# Patient Record
Sex: Female | Born: 1980 | Race: Black or African American | Hispanic: No | Marital: Single | Smoking: Current every day smoker
Health system: Southern US, Community
[De-identification: ages and names within clinical notes are randomized; demographics above are authoritative.]

## PROBLEM LIST (undated history)

## (undated) DIAGNOSIS — D649 Anemia, unspecified: Secondary | ICD-10-CM

## (undated) DIAGNOSIS — I10 Essential (primary) hypertension: Secondary | ICD-10-CM

## (undated) DIAGNOSIS — B9681 Helicobacter pylori [H. pylori] as the cause of diseases classified elsewhere: Secondary | ICD-10-CM

## (undated) DIAGNOSIS — K219 Gastro-esophageal reflux disease without esophagitis: Secondary | ICD-10-CM

## (undated) DIAGNOSIS — K2970 Gastritis, unspecified, without bleeding: Secondary | ICD-10-CM

## (undated) DIAGNOSIS — K279 Peptic ulcer, site unspecified, unspecified as acute or chronic, without hemorrhage or perforation: Secondary | ICD-10-CM

## (undated) HISTORY — DX: Anemia, unspecified: D64.9

## (undated) HISTORY — PX: SHOULDER SURGERY: SHX246

## (undated) HISTORY — DX: Gastritis, unspecified, without bleeding: K29.70

## (undated) HISTORY — DX: Helicobacter pylori (H. pylori) as the cause of diseases classified elsewhere: B96.81

## (undated) HISTORY — DX: Gastro-esophageal reflux disease without esophagitis: K21.9

## (undated) HISTORY — DX: Peptic ulcer, site unspecified, unspecified as acute or chronic, without hemorrhage or perforation: K27.9

---

## 2013-12-29 ENCOUNTER — Encounter (HOSPITAL_BASED_OUTPATIENT_CLINIC_OR_DEPARTMENT_OTHER): Payer: Self-pay | Admitting: Emergency Medicine

## 2013-12-29 ENCOUNTER — Emergency Department (HOSPITAL_BASED_OUTPATIENT_CLINIC_OR_DEPARTMENT_OTHER)
Admission: EM | Admit: 2013-12-29 | Discharge: 2013-12-30 | Disposition: A | Discharge status: Home / Self Care | Payer: Medicaid Other | Attending: Emergency Medicine | Admitting: Emergency Medicine

## 2013-12-29 DIAGNOSIS — I10 Essential (primary) hypertension: Secondary | ICD-10-CM | POA: Insufficient documentation

## 2013-12-29 DIAGNOSIS — F17200 Nicotine dependence, unspecified, uncomplicated: Secondary | ICD-10-CM | POA: Diagnosis not present

## 2013-12-29 DIAGNOSIS — R059 Cough, unspecified: Secondary | ICD-10-CM

## 2013-12-29 DIAGNOSIS — R05 Cough: Secondary | ICD-10-CM | POA: Diagnosis present

## 2013-12-29 DIAGNOSIS — J069 Acute upper respiratory infection, unspecified: Secondary | ICD-10-CM | POA: Diagnosis not present

## 2013-12-29 HISTORY — DX: Essential (primary) hypertension: I10

## 2013-12-29 NOTE — ED Notes (Signed)
PT presents with chest congestion and sinus pain that started yesterday.

## 2013-12-30 ENCOUNTER — Emergency Department (HOSPITAL_BASED_OUTPATIENT_CLINIC_OR_DEPARTMENT_OTHER): Payer: Medicaid Other

## 2013-12-30 MED ORDER — HYDROCODONE-ACETAMINOPHEN 5-325 MG PO TABS: 2.0000 | ORAL_TABLET | ORAL | Status: DC | PRN
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
## 2013-12-30 NOTE — ED Provider Notes (Signed)
Medical screening examination/treatment/procedure(s) were performed by non-physician practitioner and as supervising physician I was immediately available for consultation/collaboration.    Dione Booze, MD 12/30/13 [phone]

## 2013-12-30 NOTE — Discharge Instructions (Signed)
Cough, Adult   A cough is a reflex that helps clear your throat and airways. It can help heal the body or may be a reaction to an irritated airway. A cough may only last 2 or 3 weeks (acute) or may last more than 8 weeks (chronic).   CAUSES  Acute cough:   Viral or bacterial infections.  Chronic cough:   Infections.   Allergies.   Asthma.   Post-nasal drip.   Smoking.   Heartburn or acid reflux.   Some medicines.   Chronic lung problems (COPD).   Cancer.  SYMPTOMS    Cough.   Fever.   Chest pain.   Increased breathing rate.   High-pitched whistling sound when breathing (wheezing).   Colored mucus that you cough up (sputum).  TREATMENT    A bacterial cough may be treated with antibiotic medicine.   A viral cough must run its course and will not respond to antibiotics.   Your caregiver may recommend other treatments if you have a chronic cough.  HOME CARE INSTRUCTIONS    Only take over-the-counter or prescription medicines for pain, discomfort, or fever as directed by your caregiver. Use cough suppressants only as directed by your caregiver.   Use a cold steam vaporizer or humidifier in your bedroom or home to help loosen secretions.   Sleep in a semi-upright position if your cough is worse at night.   Rest as needed.   Stop smoking if you smoke.  SEEK IMMEDIATE MEDICAL CARE IF:    You have pus in your sputum.   Your cough starts to worsen.   You cannot control your cough with suppressants and are losing sleep.   You begin coughing up blood.   You have difficulty breathing.   You develop pain which is getting worse or is uncontrolled with medicine.   You have a fever.  MAKE SURE YOU:    Understand these instructions.   Will watch your condition.   Will get help right away if you are not doing well or get worse.  Document Released: 10/07/2010 Document Revised: 07/03/2011 Document Reviewed: 10/07/2010  Exit[employer] Patient Information 2015 [employer]. This information is not intended  to replace advice given to you by your health care provider. Make sure you discuss any questions you have with your health care provider.    Chest Wall Pain  Chest wall pain is pain in or around the bones and muscles of your chest. It may take up to 6 weeks to get better. It may take longer if you must stay physically active in your work and activities.   CAUSES   Chest wall pain may happen on its own. However, it may be caused by:   A viral illness like the flu.   Injury.   Coughing.   Exercise.   Arthritis.   Fibromyalgia.   Shingles.  HOME CARE INSTRUCTIONS    Avoid overtiring physical activity. Try not to strain or perform activities that cause pain. This includes any activities using your chest or your abdominal and side muscles, especially if heavy weights are used.   Put ice on the sore area.   Put ice in a plastic bag.   Place a towel between your skin and the bag.   Leave the ice on for 15-20 minutes per hour while awake for the first 2 days.   Only take over-the-counter or prescription medicines for pain, discomfort, or fever as directed by your caregiver.  SEEK IMMEDIATE MEDICAL CARE   Will get help right away if you are not doing well or get worse. Document Released: 04/10/2005 Document Revised: 07/03/2011 Document Reviewed: 12/05/2010 [facility] Patient Information 2015 [place], [place]. This information is not intended to replace advice given to you by your health care provider. Make sure you discuss any questions you have with your health care provider.

## 2013-12-30 NOTE — ED Notes (Signed)
C/o chest congestion and cough onset yesterday  Not getting any better

## 2014-09-25 ENCOUNTER — Encounter (HOSPITAL_COMMUNITY): Payer: Self-pay | Admitting: Emergency Medicine

## 2014-09-25 ENCOUNTER — Emergency Department (HOSPITAL_COMMUNITY)
Admission: EM | Admit: 2014-09-25 | Discharge: 2014-09-26 | Disposition: A | Discharge status: Home / Self Care | Payer: Medicaid Other | Attending: Emergency Medicine | Admitting: Emergency Medicine

## 2014-09-25 DIAGNOSIS — Z3202 Encounter for pregnancy test, result negative: Secondary | ICD-10-CM | POA: Diagnosis not present

## 2014-09-25 DIAGNOSIS — I10 Essential (primary) hypertension: Secondary | ICD-10-CM | POA: Insufficient documentation

## 2014-09-25 DIAGNOSIS — M7912 Myalgia of auxiliary muscles, head and neck: Secondary | ICD-10-CM

## 2014-09-25 DIAGNOSIS — R51 Headache: Secondary | ICD-10-CM | POA: Insufficient documentation

## 2014-09-25 DIAGNOSIS — M62838 Other muscle spasm: Secondary | ICD-10-CM | POA: Diagnosis not present

## 2014-09-25 DIAGNOSIS — Z720 Tobacco use: Secondary | ICD-10-CM | POA: Insufficient documentation

## 2014-09-25 DIAGNOSIS — M542 Cervicalgia: Secondary | ICD-10-CM | POA: Diagnosis not present

## 2014-09-25 DIAGNOSIS — R519 Headache, unspecified: Secondary | ICD-10-CM

## 2014-09-25 LAB — CBC WITH DIFFERENTIAL/PLATELET
Basophils Absolute: 0 10*3/uL (ref 0.0–0.1)
Basophils Relative: 0 % (ref 0–1)
Eosinophils Absolute: 0.2 10*3/uL (ref 0.0–0.7)
Eosinophils Relative: 2 % (ref 0–5)
HCT: 33.3 % — ABNORMAL LOW (ref 36.0–46.0)
Hemoglobin: 10.6 g/dL — ABNORMAL LOW (ref 12.0–15.0)
LYMPHS PCT: 32 % (ref 12–46)
Lymphs Abs: 3.1 10*3/uL (ref 0.7–4.0)
MCH: 26 pg (ref 26.0–34.0)
MCHC: 31.8 g/dL (ref 30.0–36.0)
MCV: 81.8 fL (ref 78.0–100.0)
MONO ABS: 0.8 10*3/uL (ref 0.1–1.0)
Monocytes Relative: 9 % (ref 3–12)
NEUTROS ABS: 5.5 10*3/uL (ref 1.7–7.7)
NEUTROS PCT: 57 % (ref 43–77)
PLATELETS: 309 10*3/uL (ref 150–400)
RBC: 4.07 MIL/uL (ref 3.87–5.11)
RDW: 16.8 % — AB (ref 11.5–15.5)
WBC: 9.7 10*3/uL (ref 4.0–10.5)

## 2014-09-25 LAB — URINE MICROSCOPIC-ADD ON

## 2014-09-25 LAB — URINALYSIS, ROUTINE W REFLEX MICROSCOPIC
Bilirubin Urine: NEGATIVE
GLUCOSE, UA: NEGATIVE mg/dL
HGB URINE DIPSTICK: NEGATIVE
Ketones, ur: 15 mg/dL — AB
NITRITE: NEGATIVE
Protein, ur: 30 mg/dL — AB
SPECIFIC GRAVITY, URINE: 1.025 (ref 1.005–1.030)
UROBILINOGEN UA: 1 mg/dL (ref 0.0–1.0)
pH: 5 (ref 5.0–8.0)

## 2014-09-25 LAB — POC URINE PREG, ED: PREG TEST UR: NEGATIVE

## 2014-09-25 LAB — I-STAT CHEM 8, ED
BUN: 10 mg/dL (ref 6–20)
CHLORIDE: 105 mmol/L (ref 101–111)
CREATININE: 1.2 mg/dL — AB (ref 0.44–1.00)
Calcium, Ion: 1.17 mmol/L (ref 1.12–1.23)
Glucose, Bld: 100 mg/dL — ABNORMAL HIGH (ref 65–99)
HCT: 36 % (ref 36.0–46.0)
HEMOGLOBIN: 12.2 g/dL (ref 12.0–15.0)
Potassium: 3.3 mmol/L — ABNORMAL LOW (ref 3.5–5.1)
Sodium: 141 mmol/L (ref 135–145)
TCO2: 22 mmol/L (ref 0–100)

## 2014-09-25 MED ORDER — SODIUM CHLORIDE 0.9 % IV BOLUS (SEPSIS)
1000.0000 mL | Freq: Once | INTRAVENOUS | Status: AC
  Administered 2014-09-26: 1000 mL via INTRAVENOUS

## 2014-09-25 MED ORDER — DIAZEPAM 5 MG/ML IJ SOLN
5.0000 mg | Freq: Once | INTRAMUSCULAR | Status: AC
  Administered 2014-09-26: 5 mg via INTRAVENOUS
  Filled 2014-09-25: qty 2

## 2014-09-25 MED ORDER — DIPHENHYDRAMINE HCL 50 MG/ML IJ SOLN
12.5000 mg | Freq: Once | INTRAMUSCULAR | Status: AC
  Administered 2014-09-26: 12.5 mg via INTRAVENOUS
  Filled 2014-09-25: qty 1

## 2014-09-25 MED ORDER — KETOROLAC TROMETHAMINE 30 MG/ML IJ SOLN
30.0000 mg | Freq: Once | INTRAMUSCULAR | Status: AC
  Administered 2014-09-26: 30 mg via INTRAVENOUS
  Filled 2014-09-25: qty 1

## 2014-09-25 MED ORDER — METOCLOPRAMIDE HCL 5 MG/ML IJ SOLN
10.0000 mg | Freq: Once | INTRAMUSCULAR | Status: AC
  Administered 2014-09-26: 10 mg via INTRAVENOUS
  Filled 2014-09-25: qty 2

## 2014-09-25 NOTE — ED Notes (Signed)
Pt c/o pain in left side of head radiating into left neck xs 1 month.  Nausea off and on.  Pt alert and oriented x's 3

## 2014-09-25 NOTE — ED Provider Notes (Signed)
CSN: [id number]     Arrival date & time 09/25/14  2142 History   First MD Initiated Contact with Patient 09/25/14 2330     Chief Complaint  Patient presents with  . Headache     (Consider location/radiation/quality/duration/timing/severity/associated sxs/prior Treatment) Patient is a 34 y.o. female presenting with headaches. The history is provided by the patient and medical records. No language interpreter was used.  Headache Associated symptoms: neck pain   Associated symptoms: no abdominal pain, no back pain, no cough, no diarrhea, no fatigue, no fever, no nausea, no neck stiffness and no vomiting      Ashley Morton is a 35 y.o. female  with a hx of HTN presents to the Emergency Department complaining of gradual, constant, persistent, progressively worsening left sided headache onset 1 month ago but significantly worse in the last 2-3 days.  Pt reports the pain is sharp and shooting, beginning in her left neck and radiating into her left face and head.  She reports pain is a 10/10.  She is taking multiple doses of ibuprofen daily without relief.  She denies trauma, falls or MVCs.   Pt also took a percocet today without relief.  She intermittently has associated nausea and intermittent blurry vision.  Nothing makes it better and nothing makes it worse.  Pt denies fever, chills, chest pain, pain, SOB, vomiting, diarrhea, weakness, numbness, tingling, weakness.     Past Medical History  Diagnosis Date  . Hypertension    History reviewed. No pertinent past surgical history. No family history on file. History  Substance Use Topics  . Smoking status: Current Every Day Smoker  . Smokeless tobacco: Not on file  . Alcohol Use: No   OB History    No data available     Review of Systems  Constitutional: Negative for fever, diaphoresis, appetite change, fatigue and unexpected weight change.  HENT: Negative for mouth sores.   Eyes: Negative for visual disturbance.  Respiratory: Negative  for cough, chest tightness, shortness of breath and wheezing.   Cardiovascular: Negative for chest pain.  Gastrointestinal: Negative for nausea, vomiting, abdominal pain, diarrhea and constipation.  Endocrine: Negative for polydipsia, polyphagia and polyuria.  Genitourinary: Negative for dysuria, urgency, frequency and hematuria.  Musculoskeletal: Positive for neck pain. Negative for back pain and neck stiffness.  Skin: Negative for rash.  Allergic/Immunologic: Negative for immunocompromised state.  Neurological: Positive for headaches. Negative for syncope and light-headedness.  Hematological: Does not bruise/bleed easily.  Psychiatric/Behavioral: Negative for sleep disturbance. The patient is not nervous/anxious.       Allergies  Asa  Home Medications   Prior to Admission medications   Medication Sig Start Date End Date Taking? Authorizing Provider  HYDROcodone-acetaminophen (NORCO/VICODIN) 5-325 MG per tablet Take 1 tablet by mouth every 6 (six) hours as needed for severe pain (severe headache). 09/26/14   Lonn Im, PA-C  methocarbamol (ROBAXIN) 500 MG tablet Take 1 tablet (500 mg total) by mouth 2 (two) times daily as needed for muscle spasms (and neck pain). 09/26/14   Kailynn Satterly, PA-C   BP 147/69 mmHg  Pulse 84  Temp(Src) 98.5 F (36.9 C) (Oral)  Resp 22  Ht  (1.651 m)  Wt 243 lb 4.8 oz (110.36 kg)  BMI 40.49 kg/m2  SpO2 99%  LMP 09/01/2014 Physical Exam  Constitutional: She is oriented to person, place, and time. She appears well-developed and well-nourished. No distress.  HENT:  Head: Normocephalic and atraumatic.  Mouth/Throat: Oropharynx is clear and moist.  Eyes:  Conjunctivae and EOM are normal. Pupils are equal, round, and reactive to light. No scleral icterus.  No horizontal, vertical or rotational nystagmus  Neck: Normal range of motion. Neck supple.  Full active and passive ROM without pain No midline tenderness Mild tenderness to the  left side of the neck over the sternocleidomastoid with palpable muscle spasm close to the occipital insertion site No nuchal rigidity or meningeal signs  Cardiovascular: Normal rate, regular rhythm, normal heart sounds and intact distal pulses.   No murmur heard. Pulmonary/Chest: Effort normal and breath sounds normal. No respiratory distress. She has no wheezes. She has no rales.  Abdominal: Soft. Bowel sounds are normal. There is no tenderness. There is no rebound and no guarding.  Musculoskeletal: Normal range of motion.  Lymphadenopathy:    She has no cervical adenopathy.  Neurological: She is alert and oriented to person, place, and time. She has normal reflexes. No cranial nerve deficit. She exhibits normal muscle tone. Coordination normal.  Mental Status:  Alert, oriented, thought content appropriate. Speech fluent without evidence of aphasia. Able to follow 2 step commands without difficulty.  Cranial Nerves:  II:  Peripheral visual fields grossly normal, pupils equal, round, reactive to light III,IV, VI: ptosis not present, extra-ocular motions intact bilaterally  V,VII: smile symmetric, facial light touch sensation equal VIII: hearing grossly normal bilaterally  IX,X: gag reflex present  XI: bilateral shoulder shrug equal and strong XII: midline tongue extension  Motor:  5/5 in upper and lower extremities bilaterally including strong and equal grip strength and dorsiflexion/plantar flexion Sensory: Pinprick and light touch normal in all extremities.  Deep Tendon Reflexes: 2+ and symmetric  Cerebellar: normal finger-to-nose with bilateral upper extremities Gait: normal gait and balance CV: distal pulses palpable throughout   Skin: Skin is warm and dry. No rash noted. She is not diaphoretic.  Psychiatric: She has a normal mood and affect. Her behavior is normal. Judgment and thought content normal.  Nursing note and vitals reviewed.   ED Course  Procedures (including  critical care time) Labs Review Labs Reviewed  CBC WITH DIFFERENTIAL/PLATELET - Abnormal; Notable for the following:    Hemoglobin 10.6 (*)    HCT 33.3 (*)    RDW 16.8 (*)    All other components within normal limits  URINALYSIS, ROUTINE W REFLEX MICROSCOPIC (NOT AT [facility]) - Abnormal; Notable for the following:    APPearance CLOUDY (*)    Ketones, ur 15 (*)    Protein, ur 30 (*)    Leukocytes, UA MODERATE (*)    All other components within normal limits  URINE MICROSCOPIC-ADD ON - Abnormal; Notable for the following:    Squamous Epithelial / LPF FEW (*)    Bacteria, UA MANY (*)    Casts HYALINE CASTS (*)    All other components within normal limits  I-STAT CHEM 8, ED - Abnormal; Notable for the following:    Potassium 3.3 (*)    Creatinine, Ser 1.20 (*)    Glucose, Bld 100 (*)    All other components within normal limits  POC URINE PREG, ED    Imaging Review No results found.   EKG Interpretation None      MDM   Final diagnoses:  Nonintractable headache, unspecified chronicity pattern, unspecified headache type  Sternocleidomastoid muscle tenderness   Ashley Morton presents with headache 1 month originating from the left side the neck. On exam patient with full range of motion without pain but tenderness to palpation and palpable muscle spasm  along the proximal portion of the sternocleidomastoid.  Normal neurologic exam. Patient with intermittent nausea but no vomiting. Moist mucous membranes.  Denies seizure activity, confusion or visual changes. Will give migraine cocktail, muscle realaxer and reassess.  1:28 AM Patient with complete resolution of headache and neck pain. Patient's headache resolved with migraine cocktail. Question possible rebound headache from persisting use of NSAIDs. Recommended patient not use these for a while. Patient does not have primary care here in the area, given resource guide for follow-up and further evaluation. She remains neurologically  intact, tolerating by mouth and ambulating without difficulty here in the emergency department. Her vital signs remained stable.  BP 147/69 mmHg  Pulse 84  Temp(Src) 98.5 F (36.9 C) (Oral)  Resp 22  Ht  (1.651 m)  Wt 243 lb 4.8 oz (110.36 kg)  BMI 40.49 kg/m2  SpO2 99%  LMP 09/01/2014   Dahlia Client Swayze Kozuch, PA-C 09/26/14 0129  Linwood Dibbles, MD 09/26/14 [phone]

## 2014-09-26 DIAGNOSIS — M542 Cervicalgia: Secondary | ICD-10-CM | POA: Insufficient documentation

## 2014-09-26 DIAGNOSIS — R51 Headache: Secondary | ICD-10-CM

## 2014-09-26 DIAGNOSIS — I10 Essential (primary) hypertension: Secondary | ICD-10-CM | POA: Insufficient documentation

## 2014-09-26 DIAGNOSIS — Z720 Tobacco use: Secondary | ICD-10-CM | POA: Insufficient documentation

## 2014-09-26 MED ORDER — METHOCARBAMOL 500 MG PO TABS: 500.0000 mg | ORAL_TABLET | Freq: Two times a day (BID) | ORAL | Status: DC | PRN

## 2014-09-26 MED ORDER — HYDROCODONE-ACETAMINOPHEN 5-325 MG PO TABS: 1.0000 | ORAL_TABLET | Freq: Four times a day (QID) | ORAL | Status: DC | PRN

## 2014-09-26 NOTE — Discharge Instructions (Signed)
1. Medications: robaxin for muscle spasms and vicodin for severe headache, usual home medications 2. Treatment: rest, drink plenty of fluids,  3. Follow Up: Please followup with your primary doctor in 2-3 days for discussion of your diagnoses and further evaluation after today's visit; if you do not have a primary care doctor use the resource guide provided to find one; Please return to the ER for worsening symptoms, worsening headache or headache associated with numbness, numbness, tingling  General Headache Without Cause A headache is pain or discomfort felt around the head or neck area. The specific cause of a headache may not be found. There are many causes and types of headaches. A few common ones are:  Tension headaches.  Migraine headaches.  Cluster headaches.  Chronic daily headaches. HOME CARE INSTRUCTIONS   Keep all follow-up appointments with your caregiver or any specialist referral.  Only take over-the-counter or prescription medicines for pain or discomfort as directed by your caregiver.  Lie down in a dark, quiet room when you have a headache.  Keep a headache journal to find out what may trigger your migraine headaches. For example, write down:  What you eat and drink.  How much sleep you get.  Any change to your diet or medicines.  Try massage or other relaxation techniques.  Put ice packs or heat on the head and neck. Use these 3 to 4 times per day for 15 to 20 minutes each time, or as needed.  Limit stress.  Sit up straight, and do not tense your muscles.  Quit smoking if you smoke.  Limit alcohol use.  Decrease the amount of caffeine you drink, or stop drinking caffeine.  Eat and sleep on a regular schedule.  Get 7 to 9 hours of sleep, or as recommended by your caregiver.  Keep lights dim if bright lights bother you and make your headaches worse. SEEK MEDICAL CARE IF:   You have problems with the medicines you were prescribed.  Your medicines  are not working.  You have a change from the usual headache.  You have nausea or vomiting. SEEK IMMEDIATE MEDICAL CARE IF:   Your headache becomes severe.  You have a fever.  You have a stiff neck.  You have loss of vision.  You have muscular weakness or loss of muscle control.  You start losing your balance or have trouble walking.  You feel faint or pass out.  You have severe symptoms that are different from your first symptoms. MAKE SURE YOU:   Understand these instructions.  Will watch your condition.  Will get help right away if you are not doing well or get worse. Document Released: 04/10/2005 Document Revised: 07/03/2011 Document Reviewed: 04/26/2011 [facility] Patient Information 2015 [place], [place]. This information is not intended to replace advice given to you by your health care provider. Make sure you discuss any questions you have with your health care provider.

## 2014-09-27 ENCOUNTER — Emergency Department (HOSPITAL_COMMUNITY): Payer: Medicaid Other

## 2014-09-27 ENCOUNTER — Emergency Department (HOSPITAL_COMMUNITY)
Admission: EM | Admit: 2014-09-27 | Discharge: 2014-09-27 | Disposition: A | Discharge status: Home / Self Care | Payer: Medicaid Other | Source: Home / Self Care | Attending: Emergency Medicine | Admitting: Emergency Medicine

## 2014-09-27 ENCOUNTER — Encounter (HOSPITAL_COMMUNITY): Payer: Self-pay | Admitting: Emergency Medicine

## 2014-09-27 DIAGNOSIS — R519 Headache, unspecified: Secondary | ICD-10-CM

## 2014-09-27 DIAGNOSIS — R51 Headache: Principal | ICD-10-CM

## 2014-09-27 MED ORDER — DIAZEPAM 5 MG PO TABS
5.0000 mg | ORAL_TABLET | Freq: Once | ORAL | Status: AC
  Administered 2014-09-27: 5 mg via ORAL
  Filled 2014-09-27: qty 1

## 2014-09-27 MED ORDER — BUPIVACAINE HCL (PF) 0.5 % IJ SOLN
10.0000 mL | Freq: Once | INTRAMUSCULAR | Status: AC
  Administered 2014-09-27: 10 mL
  Filled 2014-09-27: qty 10

## 2014-09-27 MED ORDER — BUTALBITAL-APAP-CAFFEINE 50-325-40 MG PO TABS
2.0000 | ORAL_TABLET | Freq: Once | ORAL | Status: AC
  Administered 2014-09-27: 2 via ORAL
  Filled 2014-09-27: qty 2

## 2014-09-27 MED ORDER — DIAZEPAM 5 MG PO TABS: 5.0000 mg | ORAL_TABLET | Freq: Three times a day (TID) | ORAL | Status: DC | PRN

## 2014-09-27 MED ORDER — BUTALBITAL-APAP-CAFFEINE 50-325-40 MG PO TABS: 1.0000 | ORAL_TABLET | Freq: Four times a day (QID) | ORAL | Status: DC | PRN

## 2014-09-27 NOTE — Discharge Instructions (Signed)

## 2014-09-27 NOTE — ED Provider Notes (Signed)
CSN: [id number]     Arrival date & time 09/26/14  2347 History   First MD Initiated Contact with Patient 09/27/14 0355     Chief Complaint  Patient presents with  . Headache  . Neck Pain     (Consider location/radiation/quality/duration/timing/severity/associated sxs/prior Treatment) HPI 34 year old female presents to the emergency department with complaint of one month of left-sided neck and head pain.  Over the last 3 or 4 days, headache and neck pain has gotten worse associated with photophobia.  Patient was seen yesterday for same, had headache cocktail which resolved the headache.  She was discharged home with Robaxin and Vicodin with thought of muscle spasm causing symptoms.  She reports the medication is not helping.  She denies any fever or chills.  No activities that have caused the pain.  No weakness or numbness.  No prior history of migraine headaches, no trauma Past Medical History  Diagnosis Date  . Hypertension    History reviewed. No pertinent past surgical history. History reviewed. No pertinent family history. History  Substance Use Topics  . Smoking status: Current Every Day Smoker  . Smokeless tobacco: Not on file  . Alcohol Use: No   OB History    No data available     Review of Systems  See History of Present Illness; otherwise all other systems are reviewed and negative   Allergies  Asa  Home Medications   Prior to Admission medications   Medication Sig Start Date End Date Taking? Authorizing Provider  HYDROcodone-acetaminophen (NORCO/VICODIN) 5-325 MG per tablet Take 1 tablet by mouth every 6 (six) hours as needed for severe pain (severe headache). 09/26/14  Yes Hannah Muthersbaugh, PA-C  methocarbamol (ROBAXIN) 500 MG tablet Take 1 tablet (500 mg total) by mouth 2 (two) times daily as needed for muscle spasms (and neck pain). 09/26/14  Yes Hannah Muthersbaugh, PA-C   BP 130/71 mmHg  Pulse 69  Temp(Src) 97.9 F (36.6 C)  Resp 16  SpO2 100%  LMP  09/27/2014 (Exact Date) Physical Exam  Constitutional: She is oriented to person, place, and time. She appears well-developed and well-nourished.  HENT:  Head: Normocephalic and atraumatic.  Right Ear: External ear normal.  Left Ear: External ear normal.  Nose: Nose normal.  Mouth/Throat: Oropharynx is clear and moist.  Tender to palpation over left lateral neck, left paraspinal muscles and left sternocleidomastoid muscles.  Eyes: Conjunctivae and EOM are normal. Pupils are equal, round, and reactive to light.  Neck: Normal range of motion. Neck supple. No JVD present. No tracheal deviation present. No thyromegaly present.  Cardiovascular: Normal rate, regular rhythm, normal heart sounds and intact distal pulses.  Exam reveals no gallop and no friction rub.   No murmur heard. Pulmonary/Chest: Effort normal and breath sounds normal. No stridor. No respiratory distress. She has no wheezes. She has no rales. She exhibits no tenderness.  Abdominal: Soft. Bowel sounds are normal. She exhibits no distension and no mass. There is no tenderness. There is no rebound and no guarding.  Musculoskeletal: Normal range of motion. She exhibits no edema or tenderness.  Lymphadenopathy:    She has no cervical adenopathy.  Neurological: She is alert and oriented to person, place, and time. She displays normal reflexes. She exhibits normal muscle tone. Coordination normal.  Skin: Skin is warm and dry. No rash noted. No erythema. No pallor.  Psychiatric: She has a normal mood and affect. Her behavior is normal. Judgment and thought content normal.  Nursing note and vitals reviewed.  ED Course  Procedures (including critical care time) Labs Review Labs Reviewed - No data to display  Imaging Review Ct Head Wo Contrast  09/27/2014   CLINICAL DATA:  Headache.  Head and neck pain for 2 days.  EXAM: CT HEAD WITHOUT CONTRAST  TECHNIQUE: Contiguous axial images were obtained from the base of the skull through the  vertex without intravenous contrast.  COMPARISON:  None.  FINDINGS: No intracranial hemorrhage, mass effect, or midline shift. No hydrocephalus. The basilar cisterns are patent. No evidence of territorial infarct. No intracranial fluid collection. Calvarium is intact. Included paranasal sinuses and mastoid air cells are well aerated.  IMPRESSION: No acute intracranial abnormality.   Electronically Signed   By: Rubye OaksMelanie  Ehinger M.D.   On: 09/27/2014 05:08     EKG Interpretation None      MDM   Final diagnoses:  Headache    34 year old female with persistent headache.  As symptoms did resolve with headache cocktail, but now returned, plan for head CT.  Will try different medications to see if we can begin her under better control.  5:42 AM CT scan is negative.  Patient agreed to proceed with paraspinal cervical injection to help with pain.  0.5% bupivacaine was used.  1.5 cc was used on either side of C6.  Sterile procedure used.  Patient tolerated well.  7:02 AM Patient reports that she is pain-free, and ready to go home.  Marisa Severinlga Royer Cristobal, MD 09/27/14 [phone]

## 2014-09-27 NOTE — ED Notes (Signed)
Patient here from home with complaint of persistent head and neck pain. Described as shooting. Was seen here last night, given headache cocktail, and discharged with PO medications. Presents tonight because pain continues and is not responding to Rx medications.

## 2014-09-27 NOTE — ED Notes (Signed)
Pt verbalized understanding of d/c instructions and has no further questions. Pt states "I feel much better" and in no distress upon d/c.

## 2014-09-28 ENCOUNTER — Ambulatory Visit: Payer: Medicaid Other | Admitting: Obstetrics

## 2014-10-15 ENCOUNTER — Ambulatory Visit: Payer: Medicaid Other | Admitting: Certified Nurse Midwife

## 2014-11-06 ENCOUNTER — Encounter: Payer: Self-pay | Admitting: Certified Nurse Midwife

## 2014-11-06 ENCOUNTER — Ambulatory Visit (INDEPENDENT_AMBULATORY_CARE_PROVIDER_SITE_OTHER): Payer: Medicaid Other | Admitting: Certified Nurse Midwife

## 2014-11-06 VITALS — BP 125/77 | HR 78 | Temp 97.8°F | Ht 64.0 in | Wt 238.6 lb

## 2014-11-06 DIAGNOSIS — Z720 Tobacco use: Secondary | ICD-10-CM | POA: Diagnosis not present

## 2014-11-06 DIAGNOSIS — Z01419 Encounter for gynecological examination (general) (routine) without abnormal findings: Secondary | ICD-10-CM

## 2014-11-06 DIAGNOSIS — Z113 Encounter for screening for infections with a predominantly sexual mode of transmission: Secondary | ICD-10-CM | POA: Diagnosis not present

## 2014-11-06 DIAGNOSIS — E669 Obesity, unspecified: Secondary | ICD-10-CM

## 2014-11-06 DIAGNOSIS — Z3169 Encounter for other general counseling and advice on procreation: Secondary | ICD-10-CM | POA: Diagnosis not present

## 2014-11-06 DIAGNOSIS — Z124 Encounter for screening for malignant neoplasm of cervix: Secondary | ICD-10-CM

## 2014-11-06 DIAGNOSIS — N939 Abnormal uterine and vaginal bleeding, unspecified: Secondary | ICD-10-CM

## 2014-11-06 DIAGNOSIS — Z716 Tobacco abuse counseling: Secondary | ICD-10-CM | POA: Diagnosis not present

## 2014-11-06 LAB — TRIGLYCERIDES: TRIGLYCERIDES: 137 mg/dL (ref <150)

## 2014-11-06 LAB — HEMOGLOBIN A1C
Hgb A1c MFr Bld: 5.7 % — ABNORMAL HIGH (ref <5.7)
MEAN PLASMA GLUCOSE: 117 mg/dL — AB (ref <117)

## 2014-11-06 LAB — CBC WITH DIFFERENTIAL/PLATELET
BASOS ABS: 0 10*3/uL (ref 0.0–0.1)
Basophils Relative: 0 % (ref 0–1)
Eosinophils Absolute: 0.1 10*3/uL (ref 0.0–0.7)
Eosinophils Relative: 2 % (ref 0–5)
HCT: 34.3 % — ABNORMAL LOW (ref 36.0–46.0)
Hemoglobin: 10.8 g/dL — ABNORMAL LOW (ref 12.0–15.0)
Lymphocytes Relative: 29 % (ref 12–46)
Lymphs Abs: 2.1 10*3/uL (ref 0.7–4.0)
MCH: 25.7 pg — AB (ref 26.0–34.0)
MCHC: 31.5 g/dL (ref 30.0–36.0)
MCV: 81.5 fL (ref 78.0–100.0)
MONO ABS: 0.6 10*3/uL (ref 0.1–1.0)
MONOS PCT: 8 % (ref 3–12)
MPV: 11.7 fL (ref 8.6–12.4)
Neutro Abs: 4.3 10*3/uL (ref 1.7–7.7)
Neutrophils Relative %: 61 % (ref 43–77)
Platelets: 306 10*3/uL (ref 150–400)
RBC: 4.21 MIL/uL (ref 3.87–5.11)
RDW: 18 % — ABNORMAL HIGH (ref 11.5–15.5)
WBC: 7.1 10*3/uL (ref 4.0–10.5)

## 2014-11-06 LAB — COMPREHENSIVE METABOLIC PANEL
ALT: <8 U/L (ref 0–35)
AST: 13 U/L (ref 0–37)
Albumin: 3.6 g/dL (ref 3.5–5.2)
Alkaline Phosphatase: 66 U/L (ref 39–117)
BUN: 8 mg/dL (ref 6–23)
CHLORIDE: 104 meq/L (ref 96–112)
CO2: 26 meq/L (ref 19–32)
Calcium: 9 mg/dL (ref 8.4–10.5)
Creat: 0.79 mg/dL (ref 0.50–1.10)
Glucose, Bld: 73 mg/dL (ref 70–99)
POTASSIUM: 3.9 meq/L (ref 3.5–5.3)
Sodium: 139 mEq/L (ref 135–145)
Total Bilirubin: 0.3 mg/dL (ref 0.2–1.2)
Total Protein: 6.9 g/dL (ref 6.0–8.3)

## 2014-11-06 LAB — CHOLESTEROL, TOTAL: Cholesterol: 211 mg/dL — ABNORMAL HIGH (ref 0–200)

## 2014-11-06 LAB — HDL CHOLESTEROL: HDL: 39 mg/dL — ABNORMAL LOW (ref >=46)

## 2014-11-06 MED ORDER — VITAFOL ULTRA 29-0.6-0.4-200 MG PO CAPS: 1.0000 | ORAL_CAPSULE | Freq: Every day | ORAL | Status: DC

## 2014-11-06 MED ORDER — ASPIRIN EC 81 MG PO TBEC
81.0000 mg | DELAYED_RELEASE_TABLET | Freq: Every day | ORAL | Status: DC
Start: 2014-11-06 — End: 2015-01-20

## 2014-11-06 MED ORDER — BUPROPION HCL ER (SR) 100 MG PO TB12: 100.0000 mg | ORAL_TABLET | Freq: Two times a day (BID) | ORAL | Status: DC
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
## 2014-11-07 LAB — PROGESTERONE: PROGESTERONE: 0.7 ng/mL

## 2014-11-07 LAB — HEPATITIS B SURFACE ANTIGEN: Hepatitis B Surface Ag: NEGATIVE

## 2014-11-07 LAB — TSH: TSH: 3.436 u[IU]/mL (ref 0.350–4.500)

## 2014-11-07 LAB — HEPATITIS C ANTIBODY: HCV AB: NEGATIVE

## 2014-11-07 LAB — PROLACTIN: PROLACTIN: 6.4 ng/mL

## 2014-11-07 LAB — HIV ANTIBODY (ROUTINE TESTING W REFLEX): HIV: NONREACTIVE

## 2014-11-07 LAB — RPR

## 2014-11-09 LAB — TESTOSTERONE, FREE, TOTAL, SHBG
SEX HORMONE BINDING: 60 nmol/L (ref 17–124)
TESTOSTERONE FREE: 6.7 pg/mL (ref 0.6–6.8)
TESTOSTERONE-% FREE: 1.2 % (ref 0.4–2.4)
Testosterone: 55 ng/dL (ref 10–70)

## 2014-11-10 LAB — PAP IG AND HPV HIGH-RISK: HPV DNA HIGH RISK: NOT DETECTED

## 2014-11-10 LAB — SURESWAB, VAGINOSIS/VAGINITIS PLUS
ATOPOBIUM VAGINAE: 6.5 Log (cells/mL)
C. GLABRATA, DNA: NOT DETECTED
C. PARAPSILOSIS, DNA: NOT DETECTED
C. TRACHOMATIS RNA, TMA: NOT DETECTED
C. TROPICALIS, DNA: NOT DETECTED
C. albicans, DNA: NOT DETECTED
GARDNERELLA VAGINALIS: 7.4 Log (cells/mL)
LACTOBACILLUS SPECIES: NOT DETECTED Log (cells/mL)
MEGASPHAERA SPECIES: 7.3 Log (cells/mL)
N. GONORRHOEAE RNA, TMA: NOT DETECTED
T. VAGINALIS RNA, QL TMA: NOT DETECTED

## 2014-11-10 LAB — 17-HYDROXYPROGESTERONE: 17-OH-Progesterone, LC/MS/MS: 39 ng/dL

## 2014-11-11 ENCOUNTER — Other Ambulatory Visit: Payer: Self-pay | Admitting: Certified Nurse Midwife

## 2014-11-11 DIAGNOSIS — B9689 Other specified bacterial agents as the cause of diseases classified elsewhere: Secondary | ICD-10-CM

## 2014-11-11 DIAGNOSIS — N760 Acute vaginitis: Principal | ICD-10-CM

## 2014-11-11 MED ORDER — TINIDAZOLE 500 MG PO TABS: 2.0000 g | ORAL_TABLET | Freq: Every day | ORAL | Status: AC

## 2014-11-13 ENCOUNTER — Emergency Department (HOSPITAL_COMMUNITY): Payer: Medicaid Other

## 2014-11-13 ENCOUNTER — Other Ambulatory Visit: Payer: Self-pay | Admitting: *Deleted

## 2014-11-13 ENCOUNTER — Other Ambulatory Visit: Payer: Self-pay | Admitting: Certified Nurse Midwife

## 2014-11-13 ENCOUNTER — Encounter (HOSPITAL_COMMUNITY): Payer: Self-pay | Admitting: Emergency Medicine

## 2014-11-13 ENCOUNTER — Ambulatory Visit (HOSPITAL_COMMUNITY)
Admission: RE | Admit: 2014-11-13 | Discharge: 2014-11-13 | Disposition: A | Discharge status: Home / Self Care | Payer: Medicaid Other | Source: Ambulatory Visit | Attending: Certified Nurse Midwife | Admitting: Certified Nurse Midwife

## 2014-11-13 DIAGNOSIS — Z7982 Long term (current) use of aspirin: Secondary | ICD-10-CM | POA: Insufficient documentation

## 2014-11-13 DIAGNOSIS — M791 Myalgia: Secondary | ICD-10-CM | POA: Insufficient documentation

## 2014-11-13 DIAGNOSIS — Z79899 Other long term (current) drug therapy: Secondary | ICD-10-CM | POA: Insufficient documentation

## 2014-11-13 DIAGNOSIS — E669 Obesity, unspecified: Secondary | ICD-10-CM

## 2014-11-13 DIAGNOSIS — Z7689 Persons encountering health services in other specified circumstances: Secondary | ICD-10-CM

## 2014-11-13 DIAGNOSIS — R1031 Right lower quadrant pain: Secondary | ICD-10-CM | POA: Insufficient documentation

## 2014-11-13 DIAGNOSIS — Z720 Tobacco use: Secondary | ICD-10-CM | POA: Insufficient documentation

## 2014-11-13 DIAGNOSIS — I10 Essential (primary) hypertension: Secondary | ICD-10-CM | POA: Insufficient documentation

## 2014-11-13 DIAGNOSIS — R7303 Prediabetes: Secondary | ICD-10-CM

## 2014-11-13 DIAGNOSIS — J069 Acute upper respiratory infection, unspecified: Secondary | ICD-10-CM | POA: Diagnosis not present

## 2014-11-13 DIAGNOSIS — Z01419 Encounter for gynecological examination (general) (routine) without abnormal findings: Secondary | ICD-10-CM

## 2014-11-13 DIAGNOSIS — N939 Abnormal uterine and vaginal bleeding, unspecified: Secondary | ICD-10-CM | POA: Insufficient documentation

## 2014-11-13 DIAGNOSIS — R05 Cough: Secondary | ICD-10-CM | POA: Diagnosis present

## 2014-11-13 DIAGNOSIS — Z113 Encounter for screening for infections with a predominantly sexual mode of transmission: Secondary | ICD-10-CM

## 2014-11-13 LAB — I-STAT TROPONIN, ED: Troponin i, poc: 0 ng/mL (ref 0.00–0.08)

## 2014-11-13 LAB — I-STAT CHEM 8, ED
BUN: 9 mg/dL (ref 6–20)
CALCIUM ION: 1.1 mmol/L — AB (ref 1.12–1.23)
CHLORIDE: 105 mmol/L (ref 101–111)
Creatinine, Ser: 0.9 mg/dL (ref 0.44–1.00)
GLUCOSE: 117 mg/dL — AB (ref 65–99)
HEMATOCRIT: 33 % — AB (ref 36.0–46.0)
Hemoglobin: 11.2 g/dL — ABNORMAL LOW (ref 12.0–15.0)
Potassium: 3.7 mmol/L (ref 3.5–5.1)
Sodium: 140 mmol/L (ref 135–145)
TCO2: 22 mmol/L (ref 0–100)

## 2014-11-13 LAB — CBC
HCT: 30.4 % — ABNORMAL LOW (ref 36.0–46.0)
HEMOGLOBIN: 9.7 g/dL — AB (ref 12.0–15.0)
MCH: 25.9 pg — AB (ref 26.0–34.0)
MCHC: 31.9 g/dL (ref 30.0–36.0)
MCV: 81.1 fL (ref 78.0–100.0)
Platelets: 285 10*3/uL (ref 150–400)
RBC: 3.75 MIL/uL — ABNORMAL LOW (ref 3.87–5.11)
RDW: 17.3 % — ABNORMAL HIGH (ref 11.5–15.5)
WBC: 8.5 10*3/uL (ref 4.0–10.5)

## 2014-11-13 MED ORDER — ALBUTEROL SULFATE (2.5 MG/3ML) 0.083% IN NEBU
5.0000 mg | INHALATION_SOLUTION | Freq: Once | RESPIRATORY_TRACT | Status: AC
  Administered 2014-11-13: 5 mg via RESPIRATORY_TRACT

## 2014-11-13 MED ORDER — ALBUTEROL SULFATE (2.5 MG/3ML) 0.083% IN NEBU
INHALATION_SOLUTION | RESPIRATORY_TRACT | Status: AC
  Filled 2014-11-13: qty 6

## 2014-11-13 NOTE — Progress Notes (Signed)
Pt requested referral for PCP.

## 2014-11-13 NOTE — ED Notes (Addendum)
Patient here with shortness of breath and cold symptoms.  Patient states that it has been going on for 3 days.  Patient has dry, productive cough.  Patient states she is having chest pain with the shortness of breath, especially when she coughs.

## 2014-11-14 ENCOUNTER — Emergency Department (HOSPITAL_COMMUNITY)
Admission: EM | Admit: 2014-11-14 | Discharge: 2014-11-14 | Disposition: A | Discharge status: Home / Self Care | Payer: Medicaid Other | Attending: Emergency Medicine | Admitting: Emergency Medicine

## 2014-11-14 DIAGNOSIS — J069 Acute upper respiratory infection, unspecified: Secondary | ICD-10-CM

## 2014-11-14 LAB — RAPID STREP SCREEN (MED CTR MEBANE ONLY): Streptococcus, Group A Screen (Direct): NEGATIVE

## 2014-11-14 MED ORDER — IBUPROFEN 600 MG PO TABS: 600.0000 mg | ORAL_TABLET | Freq: Four times a day (QID) | ORAL | Status: DC | PRN

## 2014-11-14 MED ORDER — LORATADINE 10 MG PO TABS: 10.0000 mg | ORAL_TABLET | Freq: Every day | ORAL | Status: DC

## 2014-11-14 MED ORDER — BENZONATATE 100 MG PO CAPS: 100.0000 mg | ORAL_CAPSULE | Freq: Three times a day (TID) | ORAL | Status: DC

## 2014-11-14 NOTE — ED Provider Notes (Signed)
CSN: [id number]     Arrival date & time 11/13/14  2319 History  This chart was scribed for Loren Racer, MD by Abel Presto, ED Scribe. This patient was seen in room A04C/A04C and the patient's care was started at 1:34 AM.     Chief Complaint  Patient presents with  . Shortness of Breath  . Cough     The history is provided by the patient. No language interpreter was used.   HPI Comments: Ashley Morton is a 34 y.o. female who presents to the Emergency Department complaining of cough and SOB with onset 4 days ago. She notes associated sore throat, chest pain secondary to cough, congestion, chills and hot flashes. Pt reports she has coughed almost to the point of post-tussive emesis. She has tried Tylenol and Nyquil with no relief.  Pt reports some improvement with breathing treatment. She denies recent sick contacts. She denies fever, nausea, vomiting, and diarrhea.   Past Medical History  Diagnosis Date  . Hypertension    History reviewed. No pertinent past surgical history. Family History  Problem Relation Age of Onset  . Diabetes Maternal Grandmother   . Kidney disease Maternal Grandmother   . Heart disease Maternal Grandmother    History  Substance Use Topics  . Smoking status: Current Every Day Smoker  . Smokeless tobacco: Not on file  . Alcohol Use: No   OB History    Gravida Para Term Preterm AB TAB SAB Ectopic Multiple Living   4    1   1  3      Review of Systems  Constitutional: Positive for chills. Negative for fever.  HENT: Positive for congestion, sinus pressure and sore throat.   Respiratory: Positive for cough and shortness of breath. Negative for wheezing.   Cardiovascular: Negative for chest pain.  Gastrointestinal: Negative for nausea, vomiting, abdominal pain and diarrhea.  Genitourinary: Negative for dysuria and flank pain.  Musculoskeletal: Positive for myalgias. Negative for back pain, neck pain and neck stiffness.  Skin: Negative for rash and  wound.  Neurological: Negative for dizziness, weakness, light-headedness, numbness and headaches.  All other systems reviewed and are negative.     Allergies  Asa  Home Medications   Prior to Admission medications   Medication Sig Start Date End Date Taking? Authorizing Provider  aspirin EC 81 MG tablet Take 1 tablet (81 mg total) by mouth daily. 11/06/14  Yes Rachelle A Denney, CNM  buPROPion (WELLBUTRIN SR) 100 MG 12 hr tablet Take 1 tablet (100 mg total) by mouth 2 (two) times daily. 11/06/14 11/06/15 Yes Rachelle A Denney, CNM  hydrochlorothiazide (HYDRODIURIL) 25 MG tablet Take 25 mg by mouth daily.   Yes Historical Provider, MD  Prenat-Fe Poly-Methfol-FA-DHA (VITAFOL ULTRA) 29-0.6-0.4-200 MG CAPS Take 1 tablet by mouth daily at 10 pm. 11/06/14  Yes Rachelle A Denney, CNM  benzonatate (TESSALON) 100 MG capsule Take 1 capsule (100 mg total) by mouth every 8 (eight) hours. 11/14/14   Loren Racer, MD  ibuprofen (ADVIL,MOTRIN) 600 MG tablet Take 1 tablet (600 mg total) by mouth every 6 (six) hours as needed. 11/14/14   Loren Racer, MD  loratadine (CLARITIN) 10 MG tablet Take 1 tablet (10 mg total) by mouth daily. 11/14/14   Loren Racer, MD   BP 141/76 mmHg  Pulse 74  Temp(Src) 98.3 F (36.8 C) (Oral)  Resp 18  Ht 5\' 4"  (1.626 m)  Wt 238 lb (107.956 kg)  BMI 40.83 kg/m2  SpO2 98%  LMP 10/28/2014 Physical  Exam  Constitutional: She is oriented to person, place, and time. She appears well-developed and well-nourished. No distress.  HENT:  Head: Normocephalic and atraumatic.  Mouth/Throat: Oropharynx is clear and moist.  Bilateral nasal mucosal edema. Mild posterior pharyngeal erythema without tonsillar hypertrophy, exudates. Midline uvula  Eyes: EOM are normal. Pupils are equal, round, and reactive to light.  Neck: Normal range of motion. Neck supple.  No meningismus  Cardiovascular: Normal rate and regular rhythm.  Exam reveals no gallop and no friction rub.   No murmur  heard. Pulmonary/Chest: Effort normal and breath sounds normal. No respiratory distress. She has no wheezes. She has no rales. She exhibits no tenderness.  Abdominal: Soft. Bowel sounds are normal. She exhibits no distension and no mass. There is no tenderness. There is no rebound and no guarding.  Musculoskeletal: Normal range of motion. She exhibits no edema or tenderness.  No lower extremity swelling or pain. No bilateral CVA tenderness.  Lymphadenopathy:    She has no cervical adenopathy.  Neurological: She is alert and oriented to person, place, and time.  Moves all extremities without deficit. Sensation is grossly intact.  Skin: Skin is warm and dry. No rash noted. No erythema.  Psychiatric: She has a normal mood and affect. Her behavior is normal.  Nursing note and vitals reviewed.   ED Course  Procedures (including critical care time) DIAGNOSTIC STUDIES: Oxygen Saturation is 100% on room air, normal by my interpretation.    COORDINATION OF CARE: 1:39 AM Discussed treatment plan with patient at beside, the patient agrees with the plan and has no further questions at this time.   Labs Review Labs Reviewed  CBC - Abnormal; Notable for the following:    RBC 3.75 (*)    Hemoglobin 9.7 (*)    HCT 30.4 (*)    MCH 25.9 (*)    RDW 17.3 (*)    All other components within normal limits  I-STAT CHEM 8, ED - Abnormal; Notable for the following:    Glucose, Bld 117 (*)    Calcium, Ion 1.10 (*)    Hemoglobin 11.2 (*)    HCT 33.0 (*)    All other components within normal limits  RAPID STREP SCREEN (NOT AT [facility])  CULTURE, GROUP A STREP  I-STAT TROPOININ, ED    Imaging Review Dg Chest 2 View  11/14/2014   CLINICAL DATA:  Acute onset of shortness of breath and cough. Initial encounter.  EXAM: CHEST  2 VIEW  COMPARISON:  Chest radiograph performed 12/30/2013  FINDINGS: The lungs are well-aerated and clear. There is no evidence of focal opacification, pleural effusion or pneumothorax.   The heart is normal in size; the mediastinal contour is within normal limits. No acute osseous abnormalities are seen.  IMPRESSION: No acute cardiopulmonary process seen.   Electronically Signed   By: Roanna Raider M.D.   On: 11/14/2014 01:04   US Transvaginal Non-ob  11/13/2014   CLINICAL DATA:  Right lower quadrant pain, abnormal uterine bleeding x2 years, status post LEEP in 2001  EXAM: TRANSABDOMINAL AND TRANSVAGINAL ULTRASOUND OF PELVIS  TECHNIQUE: Both transabdominal and transvaginal ultrasound examinations of the pelvis were performed. Transabdominal technique was performed for global imaging of the pelvis including uterus, ovaries, adnexal regions, and pelvic cul-de-sac. It was necessary to proceed with endovaginal exam following the transabdominal exam to visualize the endometrium.  COMPARISON:  None  FINDINGS: Uterus  Measurements: 9.4 x 5.4 x 6.1 cm. No fibroids or other mass visualized.  Endometrium  Thickness: 15 mm. 11 x 7 x 14 mm hypoechoic lesion near the fundus, without appreciable vascularity.  Right ovary  Measurements: 3.0 x 2.0 x 2.2 cm. Normal appearance/no adnexal mass.  Left ovary  Measurements: 2.7 x 2.3 x 2.1 cm. Normal appearance/no adnexal mass.  Other findings  Trace pelvic fluid.  IMPRESSION: 14 mm focal endometrial lesion near the uterine fundus, possibly reflecting an endometrial polyp. Consider hysteroscopy or endometrial sampling as clinically warranted.  Endometrial complex measures 15 mm.   Electronically Signed   By: Charline Bills M.D.   On: 11/13/2014 09:40   US Pelvis Complete  11/13/2014   CLINICAL DATA:  Right lower quadrant pain, abnormal uterine bleeding x2 years, status post LEEP in 2001  EXAM: TRANSABDOMINAL AND TRANSVAGINAL ULTRASOUND OF PELVIS  TECHNIQUE: Both transabdominal and transvaginal ultrasound examinations of the pelvis were performed. Transabdominal technique was performed for global imaging of the pelvis including uterus, ovaries, adnexal regions,  and pelvic cul-de-sac. It was necessary to proceed with endovaginal exam following the transabdominal exam to visualize the endometrium.  COMPARISON:  None  FINDINGS: Uterus  Measurements: 9.4 x 5.4 x 6.1 cm. No fibroids or other mass visualized.  Endometrium  Thickness: 15 mm. 11 x 7 x 14 mm hypoechoic lesion near the fundus, without appreciable vascularity.  Right ovary  Measurements: 3.0 x 2.0 x 2.2 cm. Normal appearance/no adnexal mass.  Left ovary  Measurements: 2.7 x 2.3 x 2.1 cm. Normal appearance/no adnexal mass.  Other findings  Trace pelvic fluid.  IMPRESSION: 14 mm focal endometrial lesion near the uterine fundus, possibly reflecting an endometrial polyp. Consider hysteroscopy or endometrial sampling as clinically warranted.  Endometrial complex measures 15 mm.   Electronically Signed   By: Charline Bills M.D.   On: 11/13/2014 09:40     EKG Interpretation None      MDM   Final diagnoses:  URI (upper respiratory infection)    I personally performed the services described in this documentation, which was scribed in my presence. The recorded information has been reviewed and is accurate.  Clear chest x-ray. Likely has viral upper respiratory infection. We'll treat symptomatically. Return precautions given.    Loren Racer, MD 11/14/14 [id number]

## 2014-11-14 NOTE — Discharge Instructions (Signed)
Upper Respiratory Infection, Adult An upper respiratory infection (URI) is also sometimes known as the common cold. The upper respiratory tract includes the nose, sinuses, throat, trachea, and bronchi. Bronchi are the airways leading to the lungs. Most people improve within 1 week, but symptoms can last up to 2 weeks. A residual cough may last even longer.  CAUSES Many different viruses can infect the tissues lining the upper respiratory tract. The tissues become irritated and inflamed and often become very moist. Mucus production is also common. A cold is contagious. You can easily spread the virus to others by oral contact. This includes kissing, sharing a glass, coughing, or sneezing. Touching your mouth or nose and then touching a surface, which is then touched by another person, can also spread the virus. SYMPTOMS  Symptoms typically develop 1 to 3 days after you come in contact with a cold virus. Symptoms vary from person to person. They may include:  Runny nose.  Sneezing.  Nasal congestion.  Sinus irritation.  Sore throat.  Loss of voice (laryngitis).  Cough.  Fatigue.  Muscle aches.  Loss of appetite.  Headache.  Low-grade fever. DIAGNOSIS  You might diagnose your own cold based on familiar symptoms, since most people get a cold 2 to 3 times a year. Your caregiver can confirm this based on your exam. Most importantly, your caregiver can check that your symptoms are not due to another disease such as strep throat, sinusitis, pneumonia, asthma, or epiglottitis. Blood tests, throat tests, and X-rays are not necessary to diagnose a common cold, but they may sometimes be helpful in excluding other more serious diseases. Your caregiver will decide if any further tests are required. RISKS AND COMPLICATIONS  You may be at risk for a more severe case of the common cold if you smoke cigarettes, have chronic heart disease (such as heart failure) or lung disease (such as asthma), or if  you have a weakened immune system. The very young and very old are also at risk for more serious infections. Bacterial sinusitis, middle ear infections, and bacterial pneumonia can complicate the common cold. The common cold can worsen asthma and chronic obstructive pulmonary disease (COPD). Sometimes, these complications can require emergency medical care and may be life-threatening. PREVENTION  The best way to protect against getting a cold is to practice good hygiene. Avoid oral or hand contact with people with cold symptoms. Wash your hands often if contact occurs. There is no clear evidence that vitamin C, vitamin E, echinacea, or exercise reduces the chance of developing a cold. However, it is always recommended to get plenty of rest and practice good nutrition. TREATMENT  Treatment is directed at relieving symptoms. There is no cure. Antibiotics are not effective, because the infection is caused by a virus, not by bacteria. Treatment may include:  Increased fluid intake. Sports drinks offer valuable electrolytes, sugars, and fluids.  Breathing heated mist or steam (vaporizer or shower).  Eating chicken soup or other clear broths, and maintaining good nutrition.  Getting plenty of rest.  Using gargles or lozenges for comfort.  Controlling fevers with ibuprofen or acetaminophen as directed by your caregiver.  Increasing usage of your inhaler if you have asthma. Zinc gel and zinc lozenges, taken in the first 24 hours of the common cold, can shorten the duration and lessen the severity of symptoms. Pain medicines may help with fever, muscle aches, and throat pain. A variety of non-prescription medicines are available to treat congestion and runny nose. Your caregiver   can make recommendations and may suggest nasal or lung inhalers for other symptoms.  HOME CARE INSTRUCTIONS   Only take over-the-counter or prescription medicines for pain, discomfort, or fever as directed by your  caregiver.  Use a warm mist humidifier or inhale steam from a shower to increase air moisture. This may keep secretions moist and make it easier to breathe.  Drink enough water and fluids to keep your urine clear or pale yellow.  Rest as needed.  Return to work when your temperature has returned to normal or as your caregiver advises. You may need to stay home longer to avoid infecting others. You can also use a face mask and careful hand washing to prevent spread of the virus. SEEK MEDICAL CARE IF:   After the first few days, you feel you are getting worse rather than better.  You need your caregiver's advice about medicines to control symptoms.  You develop chills, worsening shortness of breath, or brown or red sputum. These may be signs of pneumonia.  You develop yellow or brown nasal discharge or pain in the face, especially when you bend forward. These may be signs of sinusitis.  You develop a fever, swollen neck glands, pain with swallowing, or white areas in the back of your throat. These may be signs of strep throat. SEEK IMMEDIATE MEDICAL CARE IF:   You have a fever.  You develop severe or persistent headache, ear pain, sinus pain, or chest pain.  You develop wheezing, a prolonged cough, cough up blood, or have a change in your usual mucus (if you have chronic lung disease).  You develop sore muscles or a stiff neck. Document Released: 10/04/2000 Document Revised: 07/03/2011 Document Reviewed: 07/16/2013 [employer] Patient Information 2015 [employer]. This information is not intended to replace advice given to you by your health care provider. Make sure you discuss any questions you have with your health care provider.  

## 2014-11-16 LAB — CULTURE, GROUP A STREP: STREP A CULTURE: NEGATIVE

## 2014-11-24 ENCOUNTER — Telehealth: Payer: Self-pay

## 2014-11-24 ENCOUNTER — Other Ambulatory Visit: Payer: Self-pay | Admitting: Certified Nurse Midwife

## 2014-11-24 DIAGNOSIS — N939 Abnormal uterine and vaginal bleeding, unspecified: Secondary | ICD-10-CM

## 2014-11-24 DIAGNOSIS — N840 Polyp of corpus uteri: Secondary | ICD-10-CM

## 2014-11-24 NOTE — Telephone Encounter (Signed)
There should be order for hysterogram to sch from and Sue Lush should sch endometrial biopsy per Liberty Handy.

## 2014-11-24 NOTE — Telephone Encounter (Signed)
This will be don on 8/18 - ok per Victorino Dike and Sue Lush as there are no spots available in Teresa's sch or Dr. Verdell Carmine

## 2014-11-24 NOTE — Telephone Encounter (Signed)
Sonohysterogram with endometrial biopsy at the same time please on a Thursday when Ashley Morton is here.  Spoke with Dr. Clearance Coots regarding scheduling.  Sonohysterogram is in the computer.  Please pre medicate the patient.

## 2014-12-10 ENCOUNTER — Ambulatory Visit (INDEPENDENT_AMBULATORY_CARE_PROVIDER_SITE_OTHER): Payer: Medicaid Other

## 2014-12-10 ENCOUNTER — Ambulatory Visit (INDEPENDENT_AMBULATORY_CARE_PROVIDER_SITE_OTHER): Payer: Medicaid Other | Admitting: Obstetrics

## 2014-12-10 ENCOUNTER — Other Ambulatory Visit: Payer: Self-pay | Admitting: Certified Nurse Midwife

## 2014-12-10 ENCOUNTER — Encounter: Payer: Self-pay | Admitting: Obstetrics

## 2014-12-10 VITALS — BP 132/84 | HR 88 | Temp 98.1°F | Wt 240.0 lb

## 2014-12-10 DIAGNOSIS — N939 Abnormal uterine and vaginal bleeding, unspecified: Secondary | ICD-10-CM

## 2014-12-10 DIAGNOSIS — N840 Polyp of corpus uteri: Secondary | ICD-10-CM

## 2014-12-10 DIAGNOSIS — R938 Abnormal findings on diagnostic imaging of other specified body structures: Secondary | ICD-10-CM

## 2014-12-10 DIAGNOSIS — N946 Dysmenorrhea, unspecified: Secondary | ICD-10-CM

## 2014-12-10 DIAGNOSIS — Z01812 Encounter for preprocedural laboratory examination: Secondary | ICD-10-CM

## 2014-12-10 DIAGNOSIS — R9389 Abnormal findings on diagnostic imaging of other specified body structures: Secondary | ICD-10-CM

## 2014-12-10 LAB — POCT URINE PREGNANCY: PREG TEST UR: NEGATIVE

## 2014-12-10 MED ORDER — IBUPROFEN 800 MG PO TABS: 800.0000 mg | ORAL_TABLET | Freq: Three times a day (TID) | ORAL | Status: DC | PRN

## 2014-12-10 MED ORDER — MEDROXYPROGESTERONE ACETATE 10 MG PO TABS: 10.0000 mg | ORAL_TABLET | Freq: Every day | ORAL | Status: DC

## 2014-12-10 NOTE — Progress Notes (Signed)
SONOHYSTEROGRAM PROCEDURE NOTE  SONOHYSTEROGRAM PROCEDURE:   A time-out was performed confirming patient name, allergy status and procedure.  A UPT was negative.  A Graves speculum was placed in the vagina.  The cervix was prepped with betadine.  The cervix was grasped with a single tooth tenaculum.  The IUI catherter was primed with sterile water.  The IUI was introduced into the uterine cavity.  The single tooth tenaculum and speculum were removed.  The vaginal probe was placed.  The sterile water was instilled in the uterine cavity.   An endometrial aspirate was then obtained with the IUI catheter and submitted to pathology for evaluation. The IUI was removed.  The single tooth tenaculum was removed with minimal bleeding noted from the cervix.  The patient tolerated the procedure well.

## 2014-12-10 NOTE — Patient Instructions (Signed)
Dysmenorrhea Menstrual cramps (dysmenorrhea) are caused by the muscles of the uterus tightening (contracting) during a menstrual period. For some women, this discomfort is merely bothersome. For others, dysmenorrhea can be severe enough to interfere with everyday activities for a few days each month. Primary dysmenorrhea is menstrual cramps that last a couple of days when you start having menstrual periods or soon after. This often begins after a teenager starts having her period. As a woman gets older or has a baby, the cramps will usually lessen or disappear. Secondary dysmenorrhea begins later in life, lasts longer, and the pain may be stronger than primary dysmenorrhea. The pain may start before the period and last a few days after the period.  CAUSES  Dysmenorrhea is usually caused by an underlying problem, such as:  The tissue lining the uterus grows outside of the uterus in other areas of the body (endometriosis).  The endometrial tissue, which normally lines the uterus, is found in or grows into the muscular walls of the uterus (adenomyosis).  The pelvic blood vessels are engorged with blood just before the menstrual period (pelvic congestive syndrome).  Overgrowth of cells (polyps) in the lining of the uterus or cervix.  Falling down of the uterus (prolapse) because of loose or stretched ligaments.  Depression.  Bladder problems, infection, or inflammation.  Problems with the intestine, a tumor, or irritable bowel syndrome.  Cancer of the female organs or bladder.  A severely tipped uterus.  A very tight opening or closed cervix.  Noncancerous tumors of the uterus (fibroids).  Pelvic inflammatory disease (PID).  Pelvic scarring (adhesions) from a previous surgery.  Ovarian cyst.  An intrauterine device (IUD) used for birth control. RISK FACTORS You may be at greater risk of dysmenorrhea if:  You are younger than age 62.  You started puberty early.  You have  irregular or heavy bleeding.  You have never given birth.  You have a family history of this problem.  You are a smoker. SIGNS AND SYMPTOMS   Cramping or throbbing pain in your lower abdomen.  Headaches.  Lower back pain.  Nausea or vomiting.  Diarrhea.  Sweating or dizziness.  Loose stools. DIAGNOSIS  A diagnosis is based on your history, symptoms, physical exam, diagnostic tests, or procedures. Diagnostic tests or procedures may include:  Blood tests.  Ultrasonography.  An examination of the lining of the uterus (dilation and curettage, D&C).  An examination inside your abdomen or pelvis with a scope (laparoscopy).  X-rays.  CT scan.  MRI.  An examination inside the bladder with a scope (cystoscopy).  An examination inside the intestine or stomach with a scope (colonoscopy, gastroscopy). TREATMENT  Treatment depends on the cause of the dysmenorrhea. Treatment may include:  Pain medicine prescribed by your health care provider.  Birth control pills or an IUD with progesterone hormone in it.  Hormone replacement therapy.  Nonsteroidal anti-inflammatory drugs (NSAIDs). These may help stop the production of prostaglandins.  Surgery to remove adhesions, endometriosis, ovarian cyst, or fibroids.  Removal of the uterus (hysterectomy).  Progesterone shots to stop the menstrual period.  Cutting the nerves on the sacrum that go to the female organs (presacral neurectomy).  Electric current to the sacral nerves (sacral nerve stimulation).  Antidepressant medicine.  Psychiatric therapy, counseling, or group therapy.  Exercise and physical therapy.  Meditation and yoga therapy.  Acupuncture. HOME CARE INSTRUCTIONS   Only take over-the-counter or prescription medicines as directed by your health care provider.  Place a heating pad  or hot water bottle on your lower back or abdomen. Do not sleep with the heating pad.  Use aerobic exercises, walking,  swimming, biking, and other exercises to help lessen the cramping.  Massage to the lower back or abdomen may help.  Stop smoking.  Avoid alcohol and caffeine. SEEK MEDICAL CARE IF:   Your pain does not get better with medicine.  You have pain with sexual intercourse.  Your pain increases and is not controlled with medicines.  You have abnormal vaginal bleeding with your period.  You develop nausea or vomiting with your period that is not controlled with medicine. SEEK IMMEDIATE MEDICAL CARE IF:  You pass out.  Document Released: 04/10/2005 Document Revised: 12/11/2012 Document Reviewed: 09/26/2012 [facility] Patient Information 2015 [place], [place]. This information is not intended to replace advice given to you by your health care provider. Make sure you discuss any questions you have with your health care provider. Endometrial Biopsy Endometrial biopsy is a procedure in which a tissue sample is taken from inside the uterus. The tissue sample is then looked at under a microscope to see if the tissue is normal or abnormal. The endometrium is the lining of the uterus. This procedure helps determine where you are in your menstrual cycle and how hormone levels are affecting the lining of the uterus. This procedure may also be used to evaluate uterine bleeding or to diagnose endometrial cancer, tuberculosis, polyps, or inflammatory conditions.  LET [facility] CARE PROVIDER KNOW ABOUT:  Any allergies you have.  All medicines you are taking, including vitamins, herbs, eye drops, creams, and over-the-counter medicines.  Previous problems you or members of your family have had with the use of anesthetics.  Any blood disorders you have.  Previous surgeries you have had.  Medical conditions you have.  Possibility of pregnancy. RISKS AND COMPLICATIONS Generally, this is a safe procedure. However, as with any procedure, complications can occur. Possible complications  include:  Bleeding.  Pelvic infection.  Puncture of the uterine wall with the biopsy device (rare). BEFORE THE PROCEDURE   Keep a record of your menstrual cycles as directed by your health care provider. You may need to schedule your procedure for a specific time in your cycle.  You may want to bring a sanitary pad to wear home after the procedure.  Arrange for someone to drive you home after the procedure if you will be given a medicine to help you relax (sedative). PROCEDURE   You may be given a sedative to relax you.  You will lie on an exam table with your feet and legs supported as in a pelvic exam.  Your health care provider will insert an instrument (speculum) into your vagina to see your cervix.  Your cervix will be cleansed with an antiseptic solution. A medicine (local anesthetic) will be used to numb the cervix.  A forceps instrument (tenaculum) will be used to hold your cervix steady for the biopsy.  A thin, rodlike instrument (uterine sound) will be inserted through your cervix to determine the length of your uterus and the location where the biopsy sample will be removed.  A thin, flexible tube (catheter) will be inserted through your cervix and into the uterus. The catheter is used to collect the biopsy sample from your endometrial tissue.  The catheter and speculum will then be removed, and the tissue sample will be sent to a lab for examination. AFTER THE PROCEDURE  You will rest in a recovery area until you are ready  to go home.  You may have mild cramping and a small amount of vaginal bleeding for a few days after the procedure. This is normal.  Make sure you find out how to get your test results. Document Released: 08/11/2004 Document Revised: 12/11/2012 Document Reviewed: 09/25/2012 [facility] Patient Information 2015 [place], [place]. This information is not intended to replace advice given to you by your health care provider. Make sure you discuss any  questions you have with your health care provider. Hysteroscopy Hysteroscopy is a procedure used for looking inside the womb (uterus). It may be done for various reasons, including:  To evaluate abnormal bleeding, fibroid (benign, noncancerous) tumors, polyps, scar tissue (adhesions), and possibly cancer of the uterus.  To look for lumps (tumors) and other uterine growths.  To look for causes of why a woman cannot get pregnant (infertility), causes of recurrent loss of pregnancy (miscarriages), or a lost intrauterine device (IUD).  To perform a sterilization by blocking the fallopian tubes from inside the uterus. In this procedure, a thin, flexible tube with a tiny light and camera on the end of it (hysteroscope) is used to look inside the uterus. A hysteroscopy should be done right after a menstrual period to be sure you are not pregnant. LET [facility] CARE PROVIDER KNOW ABOUT:   Any allergies you have.  All medicines you are taking, including vitamins, herbs, eye drops, creams, and over-the-counter medicines.  Previous problems you or members of your family have had with the use of anesthetics.  Any blood disorders you have.  Previous surgeries you have had.  Medical conditions you have. RISKS AND COMPLICATIONS  Generally, this is a safe procedure. However, as with any procedure, complications can occur. Possible complications include:  Putting a hole in the uterus.  Excessive bleeding.  Infection.  Damage to the cervix.  Injury to other organs.  Allergic reaction to medicines.  Too much fluid used in the uterus for the procedure. BEFORE THE PROCEDURE   Ask your health care provider about changing or stopping any regular medicines.  Do not take aspirin or blood thinners for 1 week before the procedure, or as directed by your health care provider. These can cause bleeding.  If you smoke, do not smoke for 2 weeks before the procedure.  In some cases, a medicine is  placed in the cervix the day before the procedure. This medicine makes the cervix have a larger opening (dilate). This makes it easier for the instrument to be inserted into the uterus during the procedure.  Do not eat or drink anything for at least 8 hours before the surgery.  Arrange for someone to take you home after the procedure. PROCEDURE   You may be given a medicine to relax you (sedative). You may also be given one of the following:  A medicine that numbs the area around the cervix (local anesthetic).  A medicine that makes you sleep through the procedure (general anesthetic).  The hysteroscope is inserted through the vagina into the uterus. The camera on the hysteroscope sends a picture to a TV screen. This gives the surgeon a good view inside the uterus.  During the procedure, air or a liquid is put into the uterus, which allows the surgeon to see better.  Sometimes, tissue is gently scraped from inside the uterus. These tissue samples are sent to a lab for testing. AFTER THE PROCEDURE   If you had a general anesthetic, you may be groggy for a couple hours after the  procedure.  If you had a local anesthetic, you will be able to go home as soon as you are stable and feel ready.  You may have some cramping. This normally lasts for a couple days.  You may have bleeding, which varies from light spotting for a few days to menstrual-like bleeding for 3-7 days. This is normal.  If your test results are not back during the visit, make an appointment with your health care provider to find out the results. Document Released: 07/17/2000 Document Revised: 01/29/2013 Document Reviewed: 11/07/2012 [facility] Patient Information 2015 [place], [place]. This information is not intended to replace advice given to you by your health care provider. Make sure you discuss any questions you have with your health care provider. Sonohysterogram A sonohysterogram is a procedure to examine the inside of  your uterus. This exam uses sound waves sent to a computer to make real-time pictures of the inside of your uterus. To get the best images, a germ-free, saltwater solution (sterile saline) is injected into your uterus through your vagina. A sonohysterogram can show whether there is scarring or abnormal growths inside your uterus. It can also show whether your uterus is an abnormal shape or whether the lining is too thin.  LET [facility] CARE PROVIDER KNOW ABOUT:  Any allergies you have.  All medicines you are taking, including vitamins, herbs, eyedrops, creams, and over-the-counter medicines.  Previous problems you or members of your family have had with the use of anesthetics.  Any blood disorders you have.  Previous surgeries you have had.  Medical conditions you have.  The dates of your last period.  Possibility of pregnancy. RISKS AND COMPLICATIONS Generally, a sonohysterogram is a safe procedure. However, as with any procedure, problems can occur. Possible problems include:  Bleeding.  Infection. BEFORE THE PROCEDURE  Your health care provider may have you take an over-the-counter pain medicine.  You may get a prescription for antibiotic medicine.  Your health care provider may give you a pregnancy test before the procedure.  You will empty your bladder. PROCEDURE   You will lie down on the examining table with your knees raised or your feet in stirrups.  Your health care provider may do a pelvic exam before starting the procedure.  A slender, handheld device (transducer) will be lubricated and placed into your vagina.  The transducer will be positioned to send sound waves to your uterus.  The sound waves will bounce back to the transducer. They will be sent to a computer.  The computer will turn the sound waves into live images.  Your health care provider will view the images on a screen during the procedure.  Your health care provider will remove the  transducer from your vagina and use an instrument to widen the opening (speculum).  A swab will be used to clean the opening to your uterus (cervix).  A long, thin tube (catheter) will then be placed through your cervix into your uterus.  Your health care provider will fill your uterus with sterile saline through the catheter. You may feel some cramping.  The speculum will be removed.  The transducer will be placed back in your vagina to take more images. AFTER THE PROCEDURE After the procedure, it is typical to have light bleeding from your vagina, cramping, and watery vaginal discharge.  Document Released: 08/25/2013 Document Reviewed: 03/24/2013 [facility] Patient Information 2015 [place], [place]. This information is not intended to replace advice given to you by your health care provider. Make  sure you discuss any questions you have with your health care provider. Dilation and Curettage or Vacuum Curettage, Care After These instructions give you information on caring for yourself after your procedure. Your doctor may also give you more specific instructions. Call your doctor if you have any problems or questions after your procedure. HOME CARE  Do not drive for 24 hours.  Wait 1 week before doing any activities that wear you out.  Take your temperature 2 times a day for 4 days. Write it down. Tell your doctor if you have a fever.  Do not stand for a long time.  Do not lift, push, or pull anything over 10 pounds (4.5 kilograms).  Limit stair climbing to once or twice a day.  Rest often.  Continue with your usual diet.  Drink enough fluids to keep your pee (urine) clear or pale yellow.  If you have a hard time pooping (constipation), you may:  Take a medicine to help you go poop (laxative) as told by your doctor.  Eat more fruit and bran.  Drink more fluids.  Take showers, not baths, for as long as told by your doctor.  Do not swim or use a hot tub until your doctor  says it is okay.  Have someone with you for 1-2 days after the procedure.  Do not douche, use tampons, or have sex (intercourse) for 2 weeks.  Only take medicines as told by your doctor. Do not take aspirin. It can cause bleeding.  Keep all doctor visits. GET HELP IF:  You have cramps or pain not helped by medicine.  You have new pain in the belly (abdomen).  You have a bad smelling fluid coming from your vagina.  You have a rash.  You have problems with any medicine. GET HELP RIGHT AWAY IF:   You start to bleed more than a regular period.  You have a fever.  You have chest pain.  You have trouble breathing.  You feel dizzy or feel like passing out (fainting).  You pass out.  You have pain in the tops of your shoulders.  You have vaginal bleeding with or without clumps of blood (blood clots). MAKE SURE YOU:  Understand these instructions.  Will watch your condition.  Will get help right away if you are not doing well or get worse. Document Released: 01/18/2008 Document Revised: 04/15/2013 Document Reviewed: 11/07/2012 [facility] Patient Information 2015 [place], [place]. This information is not intended to replace advice given to you by your health care provider. Make sure you discuss any questions you have with your health care provider.

## 2014-12-15 ENCOUNTER — Other Ambulatory Visit: Payer: Self-pay | Admitting: Obstetrics

## 2014-12-15 DIAGNOSIS — B9689 Other specified bacterial agents as the cause of diseases classified elsewhere: Secondary | ICD-10-CM

## 2014-12-15 DIAGNOSIS — B3731 Acute candidiasis of vulva and vagina: Secondary | ICD-10-CM

## 2014-12-15 DIAGNOSIS — N760 Acute vaginitis: Principal | ICD-10-CM

## 2014-12-15 DIAGNOSIS — B373 Candidiasis of vulva and vagina: Secondary | ICD-10-CM

## 2014-12-15 LAB — SURESWAB, VAGINOSIS/VAGINITIS PLUS
ATOPOBIUM VAGINAE: DETECTED Log (cells/mL)
BV CATEGORY: UNDETERMINED — AB
C. TROPICALIS, DNA: NOT DETECTED
C. albicans, DNA: DETECTED — AB
C. glabrata, DNA: NOT DETECTED
C. parapsilosis, DNA: DETECTED — AB
C. trachomatis RNA, TMA: NOT DETECTED
GARDNERELLA VAGINALIS: 7.6 Log (cells/mL)
LACTOBACILLUS SPECIES: 6.5 Log (cells/mL)
MEGASPHAERA SPECIES: DETECTED Log (cells/mL)
N. gonorrhoeae RNA, TMA: NOT DETECTED
T. VAGINALIS RNA, QL TMA: NOT DETECTED

## 2014-12-15 MED ORDER — TINIDAZOLE 500 MG PO TABS: 1000.0000 mg | ORAL_TABLET | Freq: Every day | ORAL | Status: DC

## 2014-12-15 MED ORDER — FLUCONAZOLE 150 MG PO TABS: 150.0000 mg | ORAL_TABLET | Freq: Once | ORAL | Status: DC

## 2014-12-24 ENCOUNTER — Ambulatory Visit (INDEPENDENT_AMBULATORY_CARE_PROVIDER_SITE_OTHER): Payer: Medicaid Other | Admitting: Obstetrics

## 2014-12-24 ENCOUNTER — Encounter: Payer: Self-pay | Admitting: Obstetrics

## 2014-12-24 VITALS — BP 130/75 | HR 71 | Temp 97.6°F | Ht 64.0 in | Wt 239.0 lb

## 2014-12-24 DIAGNOSIS — N946 Dysmenorrhea, unspecified: Secondary | ICD-10-CM | POA: Diagnosis not present

## 2014-12-24 DIAGNOSIS — R9389 Abnormal findings on diagnostic imaging of other specified body structures: Secondary | ICD-10-CM

## 2014-12-24 DIAGNOSIS — E669 Obesity, unspecified: Secondary | ICD-10-CM | POA: Diagnosis not present

## 2014-12-24 DIAGNOSIS — N939 Abnormal uterine and vaginal bleeding, unspecified: Secondary | ICD-10-CM

## 2014-12-24 DIAGNOSIS — R938 Abnormal findings on diagnostic imaging of other specified body structures: Secondary | ICD-10-CM | POA: Diagnosis not present

## 2014-12-24 NOTE — Progress Notes (Signed)
Patient ID: Ashley Morton, female   DOB: 25-Mar-1981, 34 y.o.   MRN: [id number]  Chief Complaint  Patient presents with  . Follow-up    Sonohystogram    HPI [name] is a 34 y.o. female.  Presents for results of sonohysterography and endometrial biopsy   HPI  Past Medical History  Diagnosis Date  . Hypertension     History reviewed. No pertinent past surgical history.  Family History  Problem Relation Age of Onset  . Diabetes Maternal Grandmother   . Kidney disease Maternal Grandmother   . Heart disease Maternal Grandmother     Social History Social History  Substance Use Topics  . Smoking status: Current Every Day Smoker  . Smokeless tobacco: None  . Alcohol Use: No    No Active Allergies  Current Outpatient Prescriptions  Medication Sig Dispense Refill  . hydrochlorothiazide (HYDRODIURIL) 25 MG tablet Take 25 mg by mouth daily.    Marland Kitchen ibuprofen (ADVIL,MOTRIN) 800 MG tablet Take 1 tablet (800 mg total) by mouth every 8 (eight) hours as needed. 30 tablet 5  . loratadine (CLARITIN) 10 MG tablet Take 1 tablet (10 mg total) by mouth daily. 30 tablet 0  . medroxyPROGESTERone (PROVERA) 10 MG tablet Take 1 tablet (10 mg total) by mouth daily. 30 tablet 0  . Prenat-Fe Poly-Methfol-FA-DHA (VITAFOL ULTRA) 29-0.6-0.4-200 MG CAPS Take 1 tablet by mouth daily at 10 pm. 30 capsule 12  . aspirin EC 81 MG tablet Take 1 tablet (81 mg total) by mouth daily. (Patient not taking: Reported on 12/10/2014) 30 tablet 4  . buPROPion (WELLBUTRIN SR) 100 MG 12 hr tablet Take 1 tablet (100 mg total) by mouth 2 (two) times daily. (Patient not taking: Reported on 12/10/2014) 60 tablet 2   No current facility-administered medications for this visit.    Review of Systems Review of Systems Constitutional: negative for fatigue and weight loss Respiratory: negative for cough and wheezing Cardiovascular: negative for chest pain, fatigue and palpitations Gastrointestinal: negative for abdominal  pain and change in bowel habits Genitourinary:negative Integument/breast: negative for nipple discharge Musculoskeletal:negative for myalgias Neurological: negative for gait problems and tremors Behavioral/Psych: negative for abusive relationship, depression Endocrine: negative for temperature intolerance     Blood pressure 130/75, pulse 71, temperature 97.6 F (36.4 C), height  (1.626 m), weight 239 lb (108.41 kg), last menstrual period 12/21/2014.  Physical Exam Physical Exam:  Deferred  100% of 10 min visit spent on counseling and coordination of care.   Data Reviewed Pathology Sonohysterography  Assessment     AUB Dysmenorrhea Preconception counseling.  Patient desires pregnancy.    Plan    Folic acid recommended along with healthy diet and exercise F/U for annual and pap . No orders of the defined types were placed in this encounter.   No orders of the defined types were placed in this encounter.

## 2015-01-05 ENCOUNTER — Ambulatory Visit: Payer: Medicaid Other | Admitting: Dietician

## 2015-01-18 ENCOUNTER — Emergency Department (HOSPITAL_COMMUNITY)
Admission: EM | Admit: 2015-01-18 | Discharge: 2015-01-18 | Disposition: A | Discharge status: Other Acute Inpatient Hospital | Payer: Medicaid Other

## 2015-01-19 ENCOUNTER — Encounter (HOSPITAL_COMMUNITY): Payer: Self-pay | Admitting: *Deleted

## 2015-01-19 ENCOUNTER — Emergency Department (HOSPITAL_COMMUNITY): Payer: Medicaid Other

## 2015-01-19 DIAGNOSIS — I10 Essential (primary) hypertension: Secondary | ICD-10-CM | POA: Diagnosis not present

## 2015-01-19 DIAGNOSIS — R079 Chest pain, unspecified: Secondary | ICD-10-CM | POA: Insufficient documentation

## 2015-01-19 DIAGNOSIS — R2233 Localized swelling, mass and lump, upper limb, bilateral: Secondary | ICD-10-CM | POA: Diagnosis not present

## 2015-01-19 DIAGNOSIS — Z3202 Encounter for pregnancy test, result negative: Secondary | ICD-10-CM | POA: Insufficient documentation

## 2015-01-19 DIAGNOSIS — Z79899 Other long term (current) drug therapy: Secondary | ICD-10-CM | POA: Diagnosis not present

## 2015-01-19 DIAGNOSIS — Z720 Tobacco use: Secondary | ICD-10-CM | POA: Insufficient documentation

## 2015-01-19 LAB — URINALYSIS, ROUTINE W REFLEX MICROSCOPIC
Bilirubin Urine: NEGATIVE
GLUCOSE, UA: NEGATIVE mg/dL
Hgb urine dipstick: NEGATIVE
Ketones, ur: NEGATIVE mg/dL
Leukocytes, UA: NEGATIVE
Nitrite: NEGATIVE
PH: 5 (ref 5.0–8.0)
Protein, ur: NEGATIVE mg/dL
SPECIFIC GRAVITY, URINE: 1.024 (ref 1.005–1.030)
Urobilinogen, UA: 1 mg/dL (ref 0.0–1.0)

## 2015-01-19 LAB — CBC
HEMATOCRIT: 30.5 % — AB (ref 36.0–46.0)
HEMOGLOBIN: 9.4 g/dL — AB (ref 12.0–15.0)
MCH: 25.6 pg — AB (ref 26.0–34.0)
MCHC: 30.8 g/dL (ref 30.0–36.0)
MCV: 83.1 fL (ref 78.0–100.0)
Platelets: 256 10*3/uL (ref 150–400)
RBC: 3.67 MIL/uL — ABNORMAL LOW (ref 3.87–5.11)
RDW: 17.1 % — AB (ref 11.5–15.5)
WBC: 8.7 10*3/uL (ref 4.0–10.5)

## 2015-01-19 LAB — BASIC METABOLIC PANEL
ANION GAP: 8 (ref 5–15)
BUN: 11 mg/dL (ref 6–20)
CO2: 23 mmol/L (ref 22–32)
Calcium: 8.4 mg/dL — ABNORMAL LOW (ref 8.9–10.3)
Chloride: 106 mmol/L (ref 101–111)
Creatinine, Ser: 0.96 mg/dL (ref 0.44–1.00)
GFR calc Af Amer: >60 mL/min (ref >60)
Glucose, Bld: 117 mg/dL — ABNORMAL HIGH (ref 65–99)
POTASSIUM: 3.4 mmol/L — AB (ref 3.5–5.1)
Sodium: 137 mmol/L (ref 135–145)

## 2015-01-19 LAB — I-STAT TROPONIN, ED: Troponin i, poc: 0 ng/mL (ref 0.00–0.08)

## 2015-01-19 LAB — POC URINE PREG, ED: Preg Test, Ur: NEGATIVE

## 2015-01-19 NOTE — ED Notes (Signed)
Patient presents with c/o left chest pain since yesterday.  Has noticed her hands being more swollen in the morning

## 2015-01-20 ENCOUNTER — Emergency Department (HOSPITAL_COMMUNITY)
Admission: EM | Admit: 2015-01-20 | Discharge: 2015-01-20 | Disposition: A | Discharge status: Home / Self Care | Payer: Medicaid Other | Attending: Emergency Medicine | Admitting: Emergency Medicine

## 2015-01-20 DIAGNOSIS — R079 Chest pain, unspecified: Secondary | ICD-10-CM

## 2015-01-20 DIAGNOSIS — M7989 Other specified soft tissue disorders: Secondary | ICD-10-CM

## 2015-01-20 MED ORDER — PREDNISONE 20 MG PO TABS
60.0000 mg | ORAL_TABLET | Freq: Once | ORAL | Status: AC
  Administered 2015-01-20: 60 mg via ORAL
  Filled 2015-01-20: qty 3

## 2015-01-20 MED ORDER — KETOROLAC TROMETHAMINE 30 MG/ML IJ SOLN
30.0000 mg | Freq: Once | INTRAMUSCULAR | Status: DC
  Filled 2015-01-20: qty 1

## 2015-01-20 MED ORDER — PREDNISONE 20 MG PO TABS: 60.0000 mg | ORAL_TABLET | Freq: Every day | ORAL | Status: DC

## 2015-01-20 MED ORDER — KETOROLAC TROMETHAMINE 60 MG/2ML IM SOLN
60.0000 mg | Freq: Once | INTRAMUSCULAR | Status: AC
  Administered 2015-01-20: 60 mg via INTRAMUSCULAR
  Filled 2015-01-20: qty 2

## 2015-01-20 NOTE — Discharge Instructions (Signed)
Chest Pain (Nonspecific) Ashley Morton, your blood work and EKG today were normal.  Take motrin for your chest pain and see a primary doctor within 3 days for close follow up.  Take prednisone daily for 5 days to see if it helps with the swelling.  For any worsening symptoms, come back to the ED immediately.  Thank you. It is often hard to give a diagnosis for the cause of chest pain. There is always a chance that your pain could be related to something serious, such as a heart attack or a blood clot in the lungs. You need to follow up with your doctor. HOME CARE  If antibiotic medicine was given, take it as directed by your doctor. Finish the medicine even if you start to feel better.  For the next few days, avoid activities that bring on chest pain. Continue physical activities as told by your doctor.  Do not use any tobacco products. This includes cigarettes, chewing tobacco, and e-cigarettes.  Avoid drinking alcohol.  Only take medicine as told by your doctor.  Follow your doctor's suggestions for more testing if your chest pain does not go away.  Keep all doctor visits you made. GET HELP IF:  Your chest pain does not go away, even after treatment.  You have a rash with blisters on your chest.  You have a fever. GET HELP RIGHT AWAY IF:   You have more pain or pain that spreads to your arm, neck, jaw, back, or belly (abdomen).  You have shortness of breath.  You cough more than usual or cough up blood.  You have very bad back or belly pain.  You feel sick to your stomach (nauseous) or throw up (vomit).  You have very bad weakness.  You pass out (faint).  You have chills. This is an emergency. Do not wait to see if the problems will go away. Call your local emergency services (911 in [place] Do not drive yourself to the hospital. MAKE SURE YOU:   Understand these instructions.  Will watch your condition.  Will get help right away if you are not doing well or get  worse. Document Released: 09/27/2007 Document Revised: 04/15/2013 Document Reviewed: 09/27/2007 [facility] Patient Information 2015 [place], [place]. This information is not intended to replace advice given to you by your health care provider. Make sure you discuss any questions you have with your health care provider. Pulmonary Edema Pulmonary edema is abnormal fluid buildup in the lungs that can make it hard to breathe. HOME CARE  Talk to your doctor about an exercise program.  Eat a healthy diet:  Eat fresh fruits, vegetables, and lean meats.  Limit high fat and salty foods.  Avoid processed, canned, or fried foods.  Avoid fast food.  Follow your doctor's advice about taking medicine and recording the medicine you take.  Follow your doctor's advice about keeping a record of your weight.  Talk to your doctor about keeping track of your blood pressure.  Do not smoke.  Do not use nicotine patches or nicotine gum.  Make a follow-up appointment with your doctor.  Ask your doctor for a copy of your latest heart tracing (ECG) and keep a copy with you at all times. GET HELP RIGHT AWAY IF:   You have chest pain. THIS IS AN EMERGENCY. Do not wait to see if the pain will go away. Call for local emergency medical help. Do not drive yourself to the hospital.  You have sweating, feel sick to  your stomach (nauseous), or are experiencing shortness of breath.  Your weight increases more than your doctor tells you it should.  You start to have shortness of breath.  You notice more swelling in your hands, feet, ankles, or belly.  You have dizziness, blurred vision, headache, or unsteadiness that does not go away.  You cough up bloody spit.  You have a cough that does not go away.  You are unable to sleep because it is hard to breathe.  You begin to feel a "jumping" or "fluttering" sensation (palpitations) in the chest that is unusual for you. MAKE SURE YOU:   Understand these  instructions.  Will watch your condition.  Will get help right away if you are not doing well or get worse. Document Released: 03/29/2009 Document Revised: 04/15/2013 Document Reviewed: 12/16/2012 [facility] Patient Information 2015 [place], [place]. This information is not intended to replace advice given to you by your health care provider. Make sure you discuss any questions you have with your health care provider.

## 2015-01-20 NOTE — ED Provider Notes (Signed)
CSN: [id number]     Arrival date & time 01/19/15  2218 History   By signing my name below, I, Arlan Organ, attest that this documentation has been prepared under the direction and in the presence of Tomasita Crumble, MD. Electronically Signed: Arlan Organ, ED Scribe. 01/20/2015. 12:53 AM.   Chief Complaint  Patient presents with  . Chest Pain   The history is provided by the patient. No language interpreter was used.    HPI Comments: Ashley Morton is a 34 y.o. female with a PMHx of HTN who presents to the Emergency Department complaining of constant, ongoing L sided chest pain x 1 day. Pain is described as pressure. No aggravating or alleviating factors at this time. Pt also reports ongoing swelling to hands bilaterally when waking up in the morning. No OTC medications or home remedies attempted prior to arrival. No recent fever, chills, shortness of breath, nausea, or vomiting. No previous family history of autoimmune diseases. No recent long distance travel.  Denies any recent surgeries. She is not currently taking any estrogen or hormone pills. Pt with a known allergy to ASA.  Past Medical History  Diagnosis Date  . Hypertension    Past Surgical History  Procedure Laterality Date  . Shoulder surgery     Family History  Problem Relation Age of Onset  . Diabetes Maternal Grandmother   . Kidney disease Maternal Grandmother   . Heart disease Maternal Grandmother    Social History  Substance Use Topics  . Smoking status: Current Every Day Smoker  . Smokeless tobacco: Never Used  . Alcohol Use: No   OB History    Gravida Para Term Preterm AB TAB SAB Ectopic Multiple Living   Review of Systems  Constitutional: Negative for fever and appetite change.  Respiratory: Negative for cough and shortness of breath.   Cardiovascular: Positive for chest pain.  Gastrointestinal: Negative for nausea, vomiting and abdominal pain.  Musculoskeletal: Positive for joint  swelling. Negative for back pain.  Skin: Negative for rash.  Neurological: Negative for weakness, numbness and headaches.  Psychiatric/Behavioral: Negative for confusion.  All other systems reviewed and are negative.     Allergies  Aspirin  Home Medications   Prior to Admission medications   Medication Sig Start Date End Date Taking? Authorizing Provider  aspirin EC 81 MG tablet Take 1 tablet (81 mg total) by mouth daily. Patient not taking: Reported on 12/10/2014 11/06/14   Roe Coombs, CNM  buPROPion (WELLBUTRIN SR) 100 MG 12 hr tablet Take 1 tablet (100 mg total) by mouth 2 (two) times daily. Patient not taking: Reported on 12/10/2014 11/06/14 11/06/15  Rachelle A Denney, CNM  hydrochlorothiazide (HYDRODIURIL) 25 MG tablet Take 25 mg by mouth daily.    Historical Provider, MD  ibuprofen (ADVIL,MOTRIN) 800 MG tablet Take 1 tablet (800 mg total) by mouth every 8 (eight) hours as needed. 12/10/14   Brock Bad, MD  loratadine (CLARITIN) 10 MG tablet Take 1 tablet (10 mg total) by mouth daily. 11/14/14   Loren Racer, MD  medroxyPROGESTERone (PROVERA) 10 MG tablet Take 1 tablet (10 mg total) by mouth daily. 12/10/14   Brock Bad, MD  Prenat-Fe Poly-Methfol-FA-DHA (VITAFOL ULTRA) 29-0.6-0.4-200 MG CAPS Take 1 tablet by mouth daily at 10 pm. 11/06/14   Roe Coombs, CNM   Triage Vitals: BP 144/88 mmHg  Pulse 72  Temp(Src) 97.8 F (36.6 C) (Oral)  Resp 17  Ht  (1.626 m)  Wt 240 lb (108.863 kg)  BMI 41.18 kg/m2  SpO2 100%  LMP 01/16/2015   Physical Exam  Constitutional: She is oriented to person, place, and time. She appears well-developed and well-nourished. No distress.  HENT:  Head: Normocephalic and atraumatic.  Nose: Nose normal.  Mouth/Throat: Oropharynx is clear and moist. No oropharyngeal exudate.  Eyes: Conjunctivae and EOM are normal. Pupils are equal, round, and reactive to light. No scleral icterus.  Neck: Normal range of motion. Neck supple. No  JVD present. No tracheal deviation present. No thyromegaly present.  Cardiovascular: Normal rate, regular rhythm and normal heart sounds.  Exam reveals no gallop and no friction rub.   No murmur heard. Pulmonary/Chest: Effort normal and breath sounds normal. No respiratory distress. She has no wheezes. She exhibits no tenderness.  Abdominal: Soft. Bowel sounds are normal. She exhibits no distension and no mass. There is no tenderness. There is no rebound and no guarding.  Musculoskeletal: Normal range of motion. She exhibits no edema or tenderness.  Trace swelling seen to BUE, nonpitting  Lymphadenopathy:    She has no cervical adenopathy.  Neurological: She is alert and oriented to person, place, and time. No cranial nerve deficit. She exhibits normal muscle tone.  Skin: Skin is warm and dry. No rash noted. No erythema. No pallor.  Nursing note and vitals reviewed.   ED Course  Procedures (including critical care time)  DIAGNOSTIC STUDIES: Oxygen Saturation is 100% on RA, Normal by my interpretation.    COORDINATION OF CARE: 12:33 AM- Will order CXR, i-stat troponin, BMP, CBC, urinalysis, urine pregnancy, and EKG. Discussed treatment plan with pt at bedside and pt agreed to plan.     Labs Review Labs Reviewed  BASIC METABOLIC PANEL - Abnormal; Notable for the following:    Potassium 3.4 (*)    Glucose, Bld 117 (*)    Calcium 8.4 (*)    All other components within normal limits  CBC - Abnormal; Notable for the following:    RBC 3.67 (*)    Hemoglobin 9.4 (*)    HCT 30.5 (*)    MCH 25.6 (*)    RDW 17.1 (*)    All other components within normal limits  URINALYSIS, ROUTINE W REFLEX MICROSCOPIC (NOT AT [facility])  POC URINE PREG, ED  Rosezena Sensor, ED    Imaging Review Dg Chest 2 View  01/19/2015   CLINICAL DATA:  Left-sided chest pain  EXAM: CHEST  2 VIEW  COMPARISON:  11/13/2014  FINDINGS: Normal heart size and mediastinal contours. Left subclavian vascular stent noted. No  acute infiltrate or edema. No effusion or pneumothorax. No acute osseous findings.  IMPRESSION: Negative chest.   Electronically Signed   By: Marnee Spring M.D.   On: 01/19/2015 23:19   I have personally reviewed and evaluated these images and lab results as part of my medical decision-making.   EKG Interpretation   Date/Time:  Tuesday January 19 2015 22:28:12 EDT Ventricular Rate:  91 PR Interval:  178 QRS Duration: 72 QT Interval:  342 QTC Calculation: 420 R Axis:   75 Text Interpretation:  Normal sinus rhythm Nonspecific T wave abnormality  Abnormal ECG Confirmed by Erroll Luna [phone]) on 01/20/2015  12:22:20 AM      MDM   Final diagnoses:  None    Patient presents to the ED for chest pain.  Her history is not consistent with ACS.  Troponin ordered by triage is negative.  She is PERC negative.  She was given toradol for pain control.  I am unsure of what is causing her hand swelling every morning.  I do not believe it is life threatening.  Will provide trial of prednisone and encourage PCP fu.  She demonstrates understanding, she appears well and in NAD.  Her VS remain within her normal limits and she is safe ffor DC  I personally performed the services described in this documentation, which was scribed in my presence. The recorded information has been reviewed and is accurate.   Tomasita Crumble, MD 01/20/15 [phone]

## 2015-02-02 ENCOUNTER — Ambulatory Visit: Payer: Medicaid Other | Admitting: Certified Nurse Midwife

## 2015-02-03 ENCOUNTER — Ambulatory Visit: Payer: Medicaid Other | Admitting: Certified Nurse Midwife

## 2015-02-09 ENCOUNTER — Encounter (HOSPITAL_COMMUNITY): Payer: Self-pay | Admitting: Emergency Medicine

## 2015-02-09 ENCOUNTER — Emergency Department (INDEPENDENT_AMBULATORY_CARE_PROVIDER_SITE_OTHER)
Admission: EM | Admit: 2015-02-09 | Discharge: 2015-02-09 | Disposition: A | Discharge status: Home / Self Care | Payer: Medicaid Other | Source: Home / Self Care

## 2015-02-09 DIAGNOSIS — J399 Disease of upper respiratory tract, unspecified: Secondary | ICD-10-CM

## 2015-02-09 MED ORDER — GUAIFENESIN-CODEINE 100-10 MG/5ML PO SOLN: 5.0000 mL | Freq: Three times a day (TID) | ORAL | Status: DC | PRN

## 2015-02-09 MED ORDER — IPRATROPIUM BROMIDE 0.03 % NA SOLN: 2.0000 | Freq: Two times a day (BID) | NASAL | Status: DC

## 2015-02-09 NOTE — ED Provider Notes (Signed)
CSN: [id number]     Arrival date & time 02/09/15  1739 History   None    Chief Complaint  Patient presents with  . URI  . Cough    HPI Patient presents for productive cough that has been present for the past 3-4 days. Has not had a good night's rest as cough is keeping her up and makes her head hurt. Additionally endorses congestion, rhinorrhea, sore throat, and decrease appetite. Denies fever, N/V, sinus pressure, or SOB/CP. Has tried NightQuil without any relief. Works at [employer] so has multiple sick contacts. No h/o asthma or allergies. Smokes tobacco. Med allergy Aspirin.  Past Medical History  Diagnosis Date  . Hypertension    Past Surgical History  Procedure Laterality Date  . Shoulder surgery     Family History  Problem Relation Age of Onset  . Diabetes Maternal Grandmother   . Kidney disease Maternal Grandmother   . Heart disease Maternal Grandmother    Social History  Substance Use Topics  . Smoking status: Current Every Day Smoker  . Smokeless tobacco: Never Used  . Alcohol Use: No   OB History    Gravida Para Term Preterm AB TAB SAB Ectopic Multiple Living   Review of Systems As noted above. Allergies  Aspirin  Home Medications   Prior to Admission medications   Medication Sig Start Date End Date Taking? Authorizing Provider  guaiFENesin-codeine 100-10 MG/5ML syrup Take 5 mLs by mouth 3 (three) times daily as needed for cough. 02/09/15   Eva Griffo R Annalycia Done, PA-C  hydrochlorothiazide (HYDRODIURIL) 25 MG tablet Take 25 mg by mouth daily.    Historical Provider, MD  ipratropium (ATROVENT) 0.03 % nasal spray Place 2 sprays into both nostrils 2 (two) times daily. 02/09/15   Teigan Sahli R Justice Milliron, PA-C  predniSONE (DELTASONE) 20 MG tablet Take 3 tablets (60 mg total) by mouth daily. 01/20/15   Tomasita Crumble, MD   Meds Ordered and Administered this Visit  Medications - No data to display  BP 159/81 mmHg  Pulse 87  Temp(Src) 99.5 F (37.5  C) (Oral)  Resp 20  SpO2 100%  LMP 01/16/2015 No data found.   Physical Exam  Constitutional: She is oriented to person, place, and time. She appears well-developed and well-nourished. No distress.  Blood pressure 159/81, pulse 87, temperature 99.5 F (37.5 C), temperature source Oral, resp. rate 20, last menstrual period 01/16/2015, SpO2 100 %.   HENT:  Head: Normocephalic and atraumatic.  Right Ear: External ear and ear canal normal. No drainage, swelling or tenderness. No mastoid tenderness. Tympanic membrane is not injected, not scarred, not perforated, not erythematous, not retracted and not bulging. A middle ear effusion (serous fluid) is present. No hemotympanum.  Left Ear: Tympanic membrane, external ear and ear canal normal. No drainage, swelling or tenderness. No mastoid tenderness. Tympanic membrane is not injected, not scarred, not perforated, not erythematous, not retracted and not bulging.  No middle ear effusion. No hemotympanum.  Nose: Rhinorrhea (with mild erythema) present. Right sinus exhibits no maxillary sinus tenderness and no frontal sinus tenderness. Left sinus exhibits no maxillary sinus tenderness and no frontal sinus tenderness.  Mouth/Throat: Uvula is midline and mucous membranes are normal. Posterior oropharyngeal erythema present. No oropharyngeal exudate or posterior oropharyngeal edema.  Eyes: Conjunctivae are normal. Pupils are equal, round, and reactive to light. Right eye exhibits no discharge. Left eye exhibits no discharge. No scleral icterus.  Neck: Normal range of motion. Neck supple. No thyromegaly present.  Cardiovascular: Normal rate, regular rhythm and normal heart sounds.  Exam reveals no gallop and no friction rub.   No murmur heard. Pulmonary/Chest: Effort normal and breath sounds normal. No respiratory distress. She has no decreased breath sounds. She has no wheezes. She has no rhonchi. She has no rales.  Abdominal: Soft. Bowel sounds are normal.  She exhibits no distension. There is no tenderness. There is no rebound and no guarding.  Lymphadenopathy:    She has cervical adenopathy.  Neurological: She is alert and oriented to person, place, and time.  Skin: Skin is warm and dry. No rash noted. She is not diaphoretic. No erythema.    ED Course  Procedures (including critical care time)  Labs Review Labs Reviewed - No data to display  Imaging Review No results found.   MDM   1. Upper respiratory disease    Problem List Items Addressed This Visit    None    Visit Diagnoses    Upper respiratory disease    -  Primary    Relevant Medications    ipratropium (ATROVENT) 0.03 % nasal spray    guaiFENesin-codeine 100-10 MG/5ML syrup     Plenty of fluid and rest.    Hinton Raoishira R Lenora Gomes, PA-C 02/09/15 1907

## 2015-02-09 NOTE — ED Notes (Signed)
Pt here with frequent cough, nasal congestion, sob with greenish/yellow phlegm that started 3 dys ago Fever,chills and body aches notes Lungs clear on auscultation, no distress Currently everyday smoker

## 2015-02-09 NOTE — Discharge Instructions (Signed)
Upper Respiratory Infection, Adult Most upper respiratory infections (URIs) are a viral infection of the air passages leading to the lungs. A URI affects the nose, throat, and upper air passages. The most common type of URI is nasopharyngitis and is typically referred to as "the common cold." URIs run their course and usually go away on their own. Most of the time, a URI does not require medical attention, but sometimes a bacterial infection in the upper airways can follow a viral infection. This is called a secondary infection. Sinus and middle ear infections are common types of secondary upper respiratory infections. Bacterial pneumonia can also complicate a URI. A URI can worsen asthma and chronic obstructive pulmonary disease (COPD). Sometimes, these complications can require emergency medical care and may be life threatening.  CAUSES Almost all URIs are caused by viruses. A virus is a type of germ and can spread from one person to another.  RISKS FACTORS You may be at risk for a URI if:   You smoke.   You have chronic heart or lung disease.  You have a weakened defense (immune) system.   You are very young or very old.   You have nasal allergies or asthma.  You work in crowded or poorly ventilated areas.  You work in health care facilities or schools. SIGNS AND SYMPTOMS  Symptoms typically develop 2-3 days after you come in contact with a cold virus. Most viral URIs last 7-10 days. However, viral URIs from the influenza virus (flu virus) can last 14-18 days and are typically more severe. Symptoms may include:   Runny or stuffy (congested) nose.   Sneezing.   Cough.   Sore throat.   Headache.   Fatigue.   Fever.   Loss of appetite.   Pain in your forehead, behind your eyes, and over your cheekbones (sinus pain).  Muscle aches.  DIAGNOSIS  Your health care provider may diagnose a URI by:  Physical exam.  Tests to check that your symptoms are not due to  another condition such as:  Strep throat.  Sinusitis.  Pneumonia.  Asthma. TREATMENT  A URI goes away on its own with time. It cannot be cured with medicines, but medicines may be prescribed or recommended to relieve symptoms. Medicines may help:  Reduce your fever.  Reduce your cough.  Relieve nasal congestion. HOME CARE INSTRUCTIONS   Take medicines only as directed by your health care provider.   Gargle warm saltwater or take cough drops to comfort your throat as directed by your health care provider.  Use a warm mist humidifier or inhale steam from a shower to increase air moisture. This may make it easier to breathe.  Drink enough fluid to keep your urine clear or pale yellow.   Eat soups and other clear broths and maintain good nutrition.   Rest as needed.   Return to work when your temperature has returned to normal or as your health care provider advises. You may need to stay home longer to avoid infecting others. You can also use a face mask and careful hand washing to prevent spread of the virus.  Increase the usage of your inhaler if you have asthma.   Do not use any tobacco products, including cigarettes, chewing tobacco, or electronic cigarettes. If you need help quitting, ask your health care provider. PREVENTION  The best way to protect yourself from getting a cold is to practice good hygiene.   Avoid oral or hand contact with people with cold   symptoms.   Wash your hands often if contact occurs.  There is no clear evidence that vitamin C, vitamin E, echinacea, or exercise reduces the chance of developing a cold. However, it is always recommended to get plenty of rest, exercise, and practice good nutrition.  SEEK MEDICAL CARE IF:   You are getting worse rather than better.   Your symptoms are not controlled by medicine.   You have chills.  You have worsening shortness of breath.  You have brown or red mucus.  You have yellow or brown nasal  discharge.  You have pain in your face, especially when you bend forward.  You have a fever.  You have swollen neck glands.  You have pain while swallowing.  You have white areas in the back of your throat. SEEK IMMEDIATE MEDICAL CARE IF:   You have severe or persistent:  Headache.  Ear pain.  Sinus pain.  Chest pain.  You have chronic lung disease and any of the following:  Wheezing.  Prolonged cough.  Coughing up blood.  A change in your usual mucus.  You have a stiff neck.  You have changes in your:  Vision.  Hearing.  Thinking.  Mood. MAKE SURE YOU:   Understand these instructions.  Will watch your condition.  Will get help right away if you are not doing well or get worse.   This information is not intended to replace advice given to you by your health care provider. Make sure you discuss any questions you have with your health care provider.   Document Released: 10/04/2000 Document Revised: 08/25/2014 Document Reviewed: 07/16/2013 Elsevier Interactive Patient Education 2016 [employer] Inc[employer]  

## 2015-03-30 ENCOUNTER — Telehealth: Payer: Self-pay | Admitting: *Deleted

## 2015-03-30 NOTE — Telephone Encounter (Signed)
Patient reports she has been having pain lower R and bleeding off/on. Patient is actively trying for pregnancy so she is not on birth control. Patient states the last LMP was 11/25 and the pain started shortly after and has increasingly gotten worse. Told patient I would let Dr Vincenza HewsJarper know and see what he recommends that we do.

## 2015-03-31 NOTE — Telephone Encounter (Signed)
Needs an appointment.

## 2015-03-31 NOTE — Telephone Encounter (Signed)
Please call patient and let her know Dr Clearance CootsHarper wants to see her - Friday would be good

## 2015-04-02 ENCOUNTER — Ambulatory Visit (INDEPENDENT_AMBULATORY_CARE_PROVIDER_SITE_OTHER): Payer: Medicaid Other | Admitting: Obstetrics

## 2015-04-02 ENCOUNTER — Encounter: Payer: Self-pay | Admitting: Obstetrics

## 2015-04-02 VITALS — BP 138/79 | HR 74 | Wt 225.0 lb

## 2015-04-02 DIAGNOSIS — R102 Pelvic and perineal pain: Secondary | ICD-10-CM | POA: Diagnosis not present

## 2015-04-02 DIAGNOSIS — N939 Abnormal uterine and vaginal bleeding, unspecified: Secondary | ICD-10-CM | POA: Diagnosis not present

## 2015-04-02 MED ORDER — TRAMADOL HCL 50 MG PO TABS: 50.0000 mg | ORAL_TABLET | Freq: Four times a day (QID) | ORAL | Status: DC | PRN

## 2015-04-02 MED ORDER — IBUPROFEN 800 MG PO TABS: 800.0000 mg | ORAL_TABLET | Freq: Three times a day (TID) | ORAL | Status: DC | PRN

## 2015-04-02 MED ORDER — MEDROXYPROGESTERONE ACETATE 10 MG PO TABS: 10.0000 mg | ORAL_TABLET | Freq: Every day | ORAL | Status: DC

## 2015-04-02 NOTE — Progress Notes (Addendum)
Patient ID: Ashley Morton, female   DOB: June 23, 1980, 34 y.o.   MRN: [id number]  Chief Complaint  Patient presents with  . Pelvic Pain    lower right sided pain-dull pain, spotting, bleeding with intercourse x 2 weeks approx    HPI Ashley Morton is a 34 y.o. female.  Pelvic pain and vaginal bleeding for past 2 weeks.  Pain located RLQ.  Vaginal bleeding worse with intercourse, but no pain with intercourse.  Pain described as dull.  HPI  Past Medical History  Diagnosis Date  . Hypertension     Past Surgical History  Procedure Laterality Date  . Shoulder surgery      Family History  Problem Relation Age of Onset  . Diabetes Maternal Grandmother   . Kidney disease Maternal Grandmother   . Heart disease Maternal Grandmother     Social History Social History  Substance Use Topics  . Smoking status: Current Every Day Smoker  . Smokeless tobacco: Never Used  . Alcohol Use: No    Allergies  Allergen Reactions  . Aspirin Other (See Comments)    States she cannot take the taste of the ASA even the chewable    Current Outpatient Prescriptions  Medication Sig Dispense Refill  . guaiFENesin-codeine 100-10 MG/5ML syrup Take 5 mLs by mouth 3 (three) times daily as needed for cough. (Patient not taking: Reported on 04/02/2015) 120 mL 0  . hydrochlorothiazide (HYDRODIURIL) 25 MG tablet Take 25 mg by mouth daily.    Marland Kitchen. ibuprofen (ADVIL,MOTRIN) 800 MG tablet Take 1 tablet (800 mg total) by mouth every 8 (eight) hours as needed. 42 tablet 5  . ipratropium (ATROVENT) 0.03 % nasal spray Place 2 sprays into both nostrils 2 (two) times daily. (Patient not taking: Reported on 04/02/2015) 30 mL 0  . medroxyPROGESTERone (PROVERA) 10 MG tablet Take 1 tablet (10 mg total) by mouth daily. 14 tablet 0  . predniSONE (DELTASONE) 20 MG tablet Take 3 tablets (60 mg total) by mouth daily. (Patient not taking: Reported on 04/02/2015) 12 tablet 0  . traMADol (ULTRAM) 50 MG tablet Take 1-2 tablets (50-100  mg total) by mouth every 6 (six) hours as needed for moderate pain or severe pain. 40 tablet 2  . [DISCONTINUED] buPROPion (WELLBUTRIN SR) 100 MG 12 hr tablet Take 1 tablet (100 mg total) by mouth 2 (two) times daily. (Patient not taking: Reported on 12/10/2014) 60 tablet 2  . [DISCONTINUED] loratadine (CLARITIN) 10 MG tablet Take 1 tablet (10 mg total) by mouth daily. (Patient not taking: Reported on 01/20/2015) 30 tablet 0   No current facility-administered medications for this visit.    Review of Systems Review of Systems Constitutional: negative for fatigue and weight loss Respiratory: negative for cough and wheezing Cardiovascular: negative for chest pain, fatigue and palpitations Gastrointestinal: negative for abdominal pain and change in bowel habits Genitourinary:positive for vaginal bleeding and pelvic pain Integument/breast: negative for nipple discharge Musculoskeletal:negative for myalgias Neurological: negative for gait problems and tremors Behavioral/Psych: negative for abusive relationship, depression Endocrine: negative for temperature intolerance     Blood pressure 138/79, pulse 74, weight 225 lb (102.059 kg), last menstrual period 03/17/2015.  Physical Exam Physical Exam           General: Alert and no distress Abdomen:  normal findings: no organomegaly, soft, non-tender and no hernia  Pelvis:  External genitalia: normal general appearance Urinary system: urethral meatus normal and bladder without fullness, nontender Vaginal: normal without tenderness, induration or masses Cervix: normal appearance Adnexa: normal  bimanual exam Uterus: anteverted and non-tender, normal size      Data Reviewed Labs Previous ultrasound  Assessment     Pelvic pain AUB     Plan    Wet Prep and Cultures ordered Ultrasound ordered  Ultram Rx F/U 2 weeks  Orders Placed This Encounter  Procedures  . SureSwab, Vaginosis/Vaginitis Plus  . US Transvaginal Non-OB    Standing  Status: Future     Number of Occurrences:      Standing Expiration Date: 06/02/2016    Order Specific Question:  Reason for Exam (SYMPTOM  OR DIAGNOSIS REQUIRED)    Answer:  Pelvic pain    Order Specific Question:  Preferred imaging location?    Answer:  [facility]  . US Pelvis Complete    Standing Status: Future     Number of Occurrences:      Standing Expiration Date: 06/02/2016    Order Specific Question:  Reason for Exam (SYMPTOM  OR DIAGNOSIS REQUIRED)    Answer:  Pelvic pain    Order Specific Question:  Preferred imaging location?    Answer:  [facility]   Meds ordered this encounter  Medications  . traMADol (ULTRAM) 50 MG tablet    Sig: Take 1-2 tablets (50-100 mg total) by mouth every 6 (six) hours as needed for moderate pain or severe pain.    Dispense:  40 tablet    Refill:  2  . medroxyPROGESTERone (PROVERA) 10 MG tablet    Sig: Take 1 tablet (10 mg total) by mouth daily.    Dispense:  14 tablet    Refill:  0  . ibuprofen (ADVIL,MOTRIN) 800 MG tablet    Sig: Take 1 tablet (800 mg total) by mouth every 8 (eight) hours as needed.    Dispense:  42 tablet    Refill:  5

## 2015-04-08 ENCOUNTER — Other Ambulatory Visit: Payer: Self-pay | Admitting: Obstetrics

## 2015-04-08 DIAGNOSIS — N760 Acute vaginitis: Principal | ICD-10-CM

## 2015-04-08 DIAGNOSIS — B9689 Other specified bacterial agents as the cause of diseases classified elsewhere: Secondary | ICD-10-CM

## 2015-04-08 LAB — SURESWAB, VAGINOSIS/VAGINITIS PLUS
Atopobium vaginae: 6.9 Log (cells/mL)
C. ALBICANS, DNA: NOT DETECTED
C. GLABRATA, DNA: NOT DETECTED
C. TRACHOMATIS RNA, TMA: NOT DETECTED
C. parapsilosis, DNA: NOT DETECTED
C. tropicalis, DNA: NOT DETECTED
Gardnerella vaginalis: 8 Log (cells/mL)
LACTOBACILLUS SPECIES: NOT DETECTED Log (cells/mL)
MEGASPHAERA SPECIES: 7.4 Log (cells/mL)
N. GONORRHOEAE RNA, TMA: NOT DETECTED
T. VAGINALIS RNA, QL TMA: NOT DETECTED

## 2015-04-08 MED ORDER — TINIDAZOLE 500 MG PO TABS: 1000.0000 mg | ORAL_TABLET | Freq: Every day | ORAL | Status: DC

## 2015-04-12 ENCOUNTER — Ambulatory Visit (HOSPITAL_COMMUNITY)
Admission: RE | Admit: 2015-04-12 | Discharge: 2015-04-12 | Disposition: A | Discharge status: Home / Self Care | Payer: Medicaid Other | Source: Ambulatory Visit | Attending: Obstetrics | Admitting: Obstetrics

## 2015-04-12 DIAGNOSIS — N939 Abnormal uterine and vaginal bleeding, unspecified: Secondary | ICD-10-CM | POA: Diagnosis not present

## 2015-04-12 DIAGNOSIS — R102 Pelvic and perineal pain: Secondary | ICD-10-CM | POA: Insufficient documentation

## 2015-04-13 ENCOUNTER — Encounter: Payer: Self-pay | Admitting: Obstetrics

## 2015-04-13 ENCOUNTER — Ambulatory Visit (INDEPENDENT_AMBULATORY_CARE_PROVIDER_SITE_OTHER): Payer: Medicaid Other | Admitting: Obstetrics

## 2015-04-13 VITALS — BP 150/85 | HR 70 | Temp 98.3°F | Wt 224.0 lb

## 2015-04-13 DIAGNOSIS — N939 Abnormal uterine and vaginal bleeding, unspecified: Secondary | ICD-10-CM

## 2015-04-13 DIAGNOSIS — K589 Irritable bowel syndrome without diarrhea: Secondary | ICD-10-CM | POA: Diagnosis not present

## 2015-04-13 MED ORDER — MEDROXYPROGESTERONE ACETATE 10 MG PO TABS: 10.0000 mg | ORAL_TABLET | Freq: Every day | ORAL | Status: DC

## 2015-04-13 MED ORDER — DICYCLOMINE HCL 20 MG PO TABS: 20.0000 mg | ORAL_TABLET | Freq: Three times a day (TID) | ORAL | Status: DC

## 2015-04-14 ENCOUNTER — Encounter: Payer: Self-pay | Admitting: Obstetrics

## 2015-04-14 NOTE — Progress Notes (Signed)
Patient ID: Ashley AbrahamShanetta Lutzke, female   DOB: [birth year], 34 y.o.   MRN: [id number]  Chief Complaint  Patient presents with  . Gynecologic Exam    follow up- Ultrasound results    HPI Ashley Morton is a 34 y.o. female.  H/O AUB.  Presents for U/S results.  HPI  Past Medical History  Diagnosis Date  . Hypertension     Past Surgical History  Procedure Laterality Date  . Shoulder surgery      Family History  Problem Relation Age of Onset  . Diabetes Maternal Grandmother   . Kidney disease Maternal Grandmother   . Heart disease Maternal Grandmother     Social History Social History  Substance Use Topics  . Smoking status: Current Every Day Smoker  . Smokeless tobacco: Never Used  . Alcohol Use: No    Allergies  Allergen Reactions  . Aspirin Other (See Comments)    States she cannot take the taste of the ASA even the chewable    Current Outpatient Prescriptions  Medication Sig Dispense Refill  . hydrochlorothiazide (HYDRODIURIL) 25 MG tablet Take 25 mg by mouth daily.    Marland Kitchen. ibuprofen (ADVIL,MOTRIN) 800 MG tablet Take 1 tablet (800 mg total) by mouth every 8 (eight) hours as needed. 42 tablet 5  . dicyclomine (BENTYL) 20 MG tablet Take 1 tablet (20 mg total) by mouth 3 (three) times daily before meals. 90 tablet 2  . medroxyPROGESTERone (PROVERA) 10 MG tablet Take 1 tablet (10 mg total) by mouth daily. 10 tablet 1  . [DISCONTINUED] buPROPion (WELLBUTRIN SR) 100 MG 12 hr tablet Take 1 tablet (100 mg total) by mouth 2 (two) times daily. (Patient not taking: Reported on 12/10/2014) 60 tablet 2  . [DISCONTINUED] loratadine (CLARITIN) 10 MG tablet Take 1 tablet (10 mg total) by mouth daily. (Patient not taking: Reported on 01/20/2015) 30 tablet 0   No current facility-administered medications for this visit.    Review of Systems Review of Systems Constitutional: negative for fatigue and weight loss Respiratory: negative for cough and wheezing Cardiovascular: negative for  chest pain, fatigue and palpitations Gastrointestinal: negative for abdominal pain and change in bowel habits Genitourinary: positive for prolonged periods Integument/breast: negative for nipple discharge Musculoskeletal:negative for myalgias Neurological: negative for gait problems and tremors Behavioral/Psych: negative for abusive relationship, depression Endocrine: negative for temperature intolerance     Blood pressure 150/85, pulse 70, temperature 98.3 F (36.8 C), weight 224 lb (101.606 kg), last menstrual period 04/04/2015.  Physical Exam Physical Exam: Deferred  100% of 10 min visit spent on counseling and coordination of care.   Data Reviewed Ultrasound Pap smear  Labs  Assessment     AUB.  Ultrasound WNL's.  ? IBS    Plan    Provera cycle regulation  Bentyl Rx F/U 3 months  No orders of the defined types were placed in this encounter.   Meds ordered this encounter  Medications  . dicyclomine (BENTYL) 20 MG tablet    Sig: Take 1 tablet (20 mg total) by mouth 3 (three) times daily before meals.    Dispense:  90 tablet    Refill:  2  . medroxyPROGESTERone (PROVERA) 10 MG tablet    Sig: Take 1 tablet (10 mg total) by mouth daily.    Dispense:  10 tablet    Refill:  1

## 2015-07-12 ENCOUNTER — Ambulatory Visit: Payer: Medicaid Other | Admitting: Obstetrics

## 2015-08-10 IMAGING — CR DG CHEST 2V
2 series · 2 of 2 positions shown · non-contrast
Comparison: Chest radiograph performed 12/30/2013

CLINICAL DATA: Acute onset of shortness of breath and cough.
Initial encounter.

EXAM:
CHEST  2 VIEW

[chest pa]
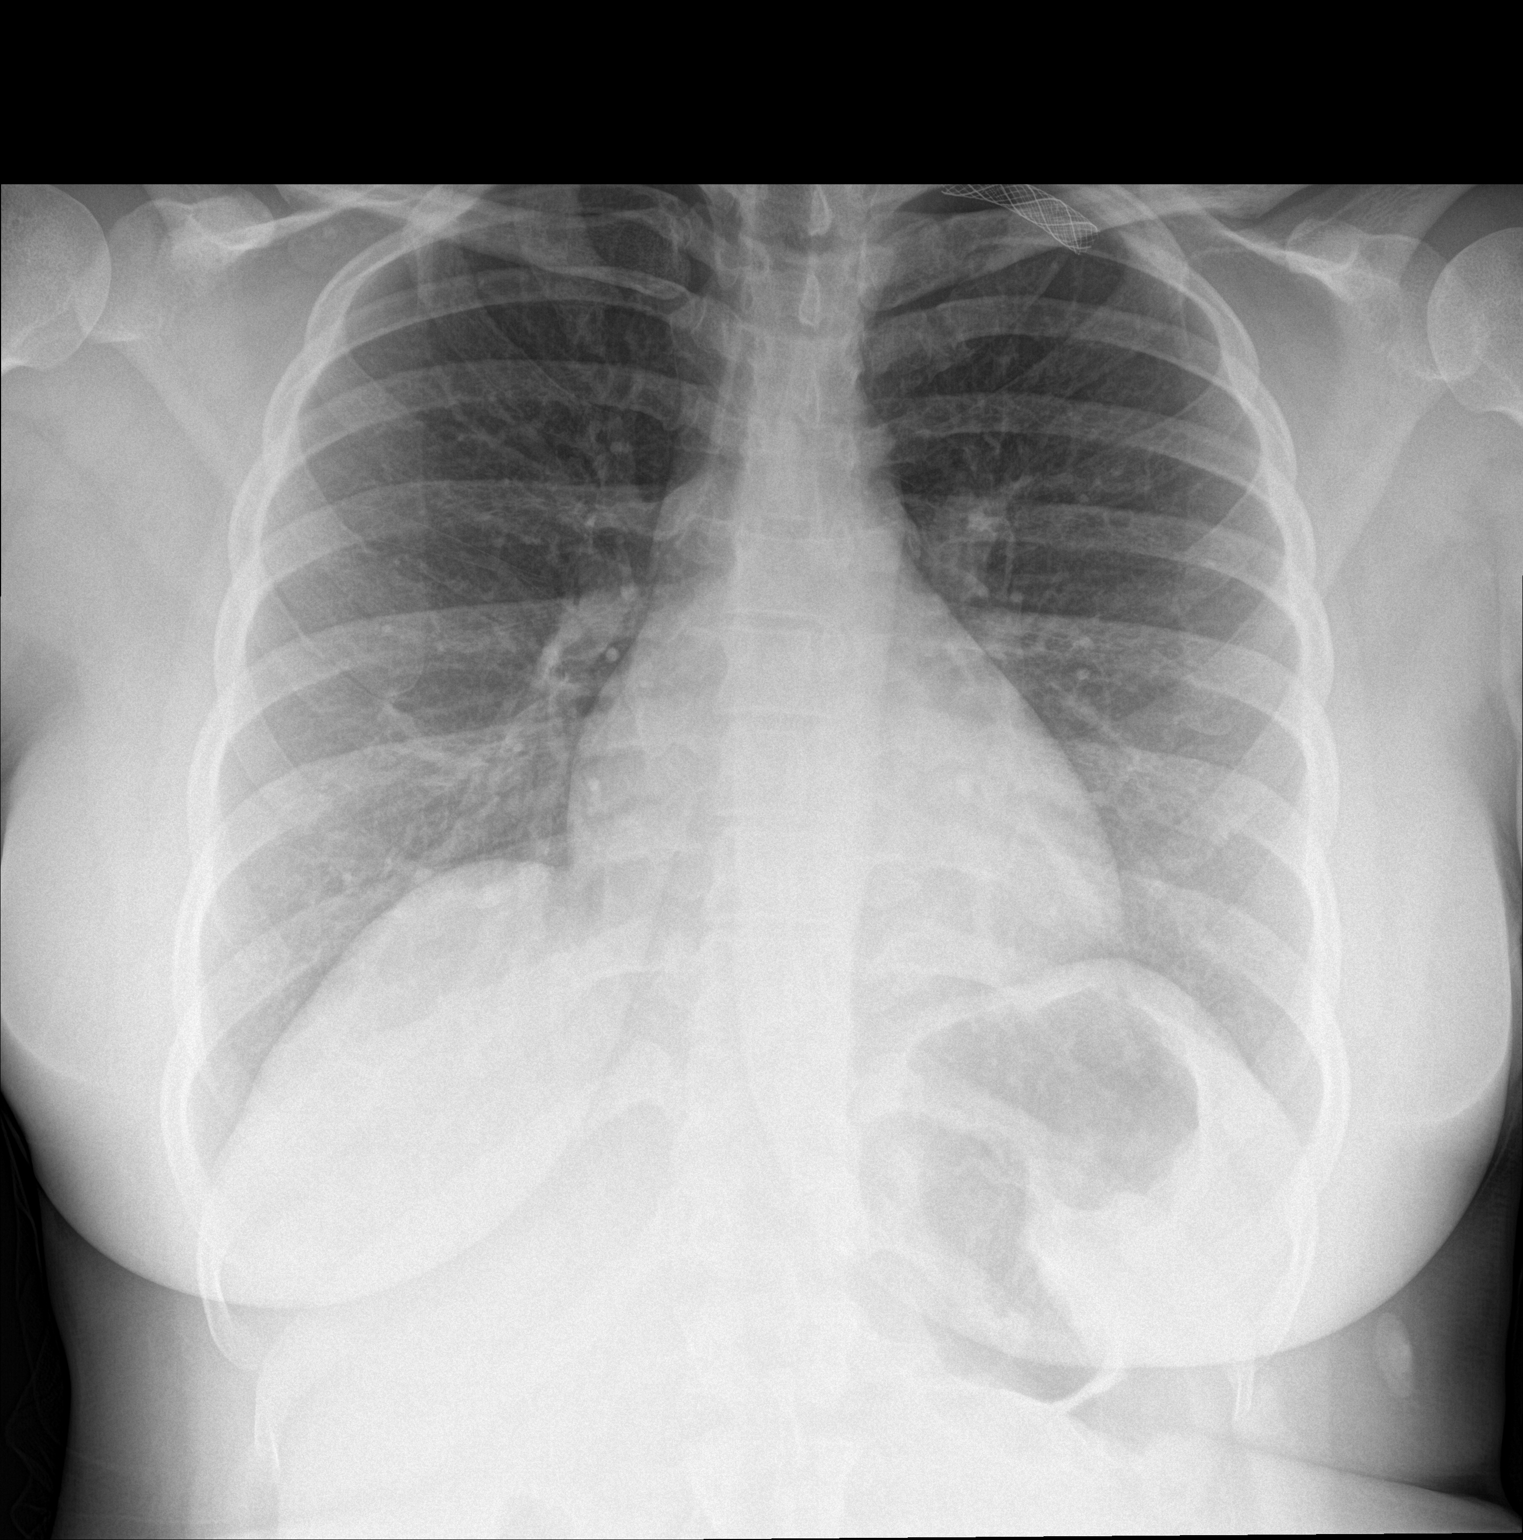

[chest lat]
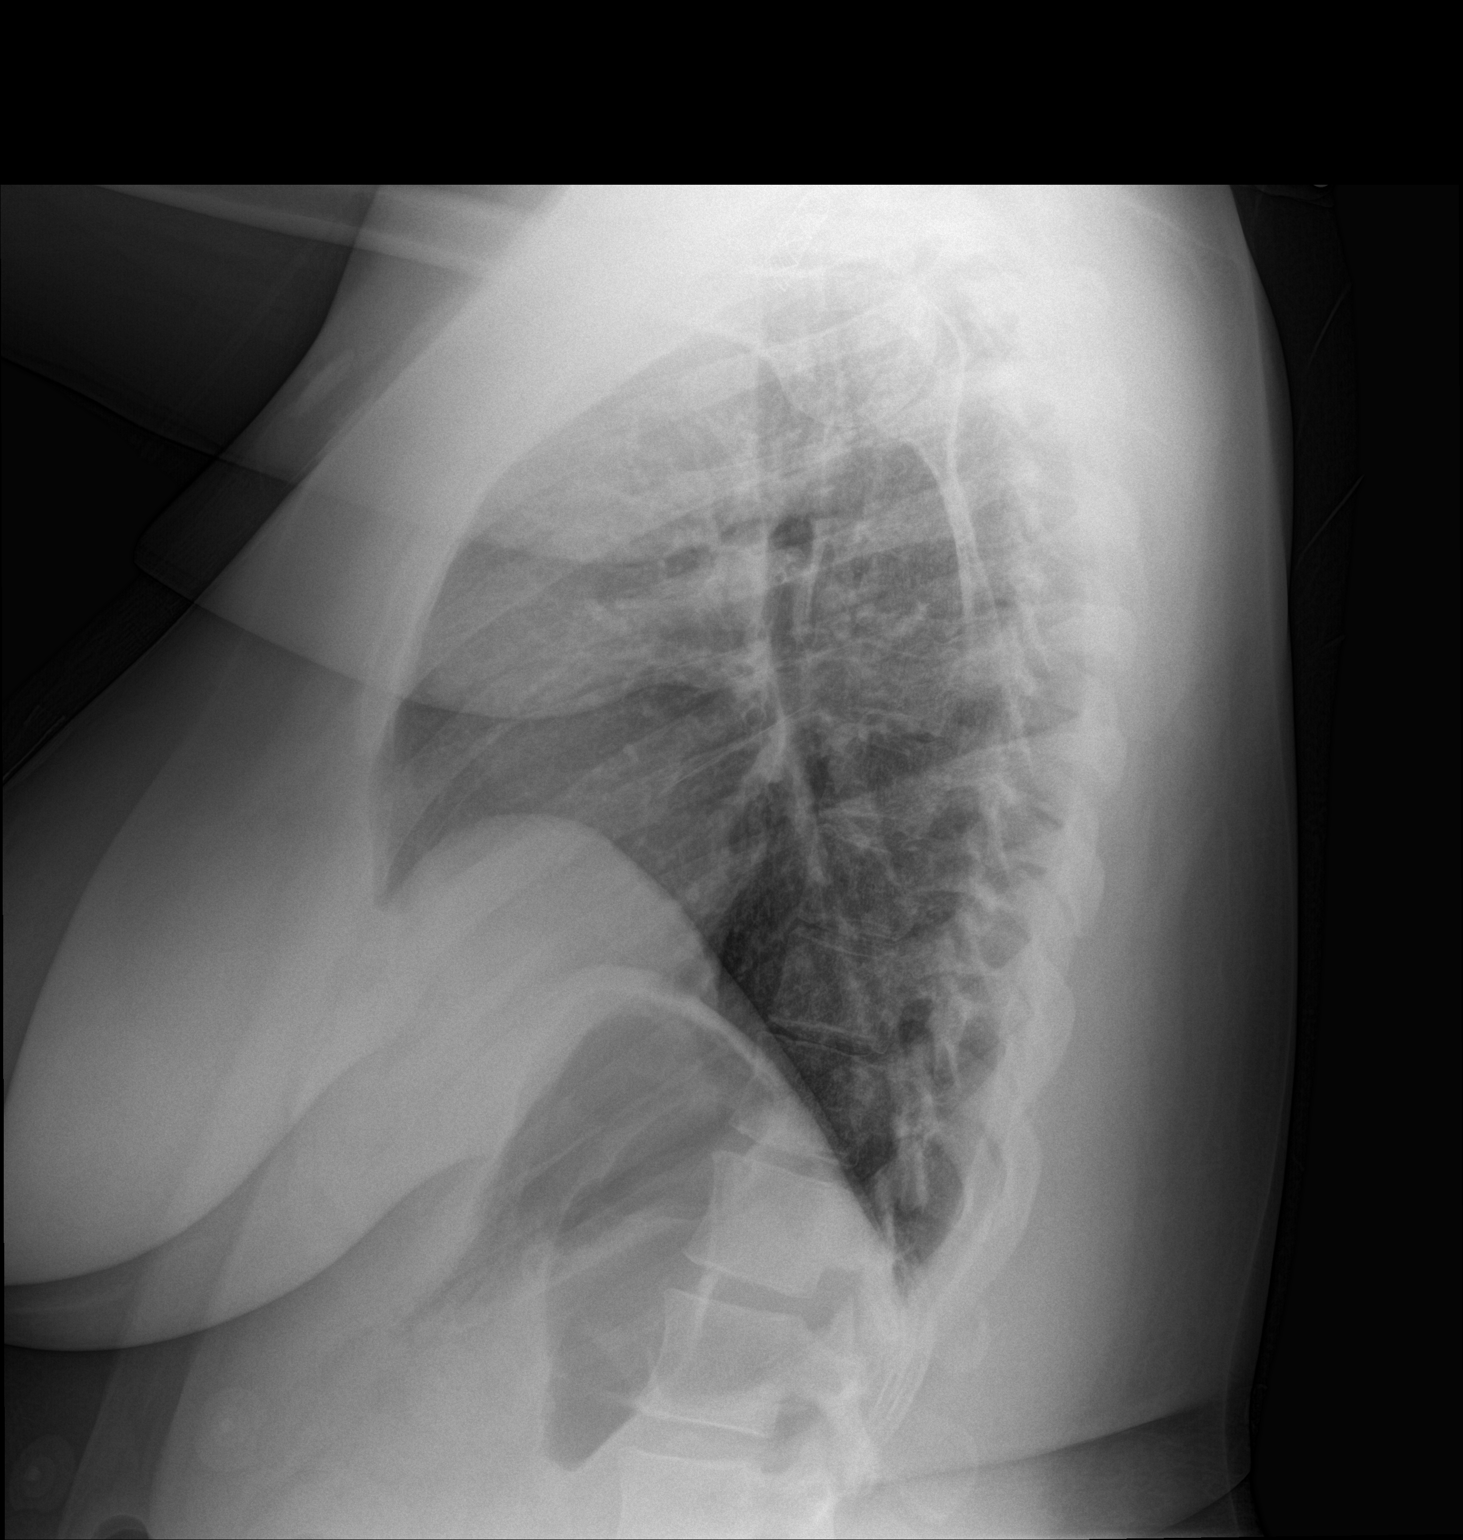

[2 of 2 positions shown; findings below may reference images not displayed]

FINDINGS: The lungs are well-aerated and clear. There is no evidence of focal
opacification, pleural effusion or pneumothorax.

The heart is normal in size; the mediastinal contour is within
normal limits. No acute osseous abnormalities are seen.
IMPRESSION: No acute cardiopulmonary process seen.

## 2015-08-10 IMAGING — US US PELVIS COMPLETE
1 series · 14 of 25 positions shown · non-contrast
Comparison: None

CLINICAL DATA: Right lower quadrant pain, abnormal uterine bleeding
x2 years, status post LEEP in 0888



[Series 1: us non-ob tv/pel · 14 of 53 slices shown]
[im 1/53]
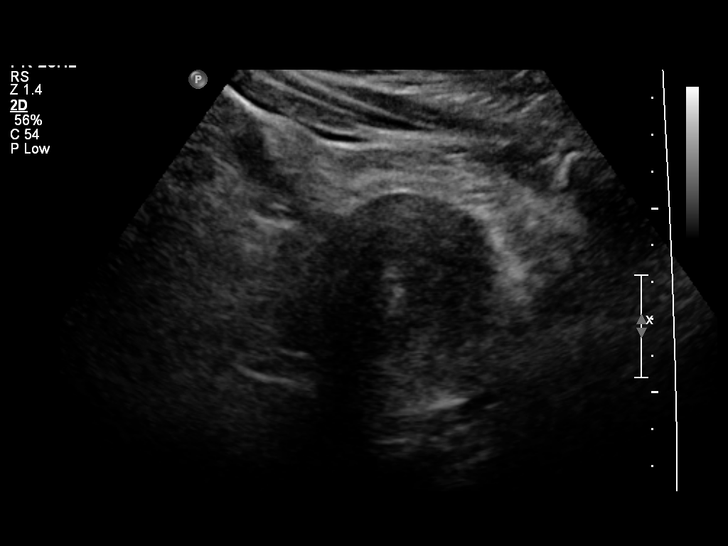
[im 5/53]
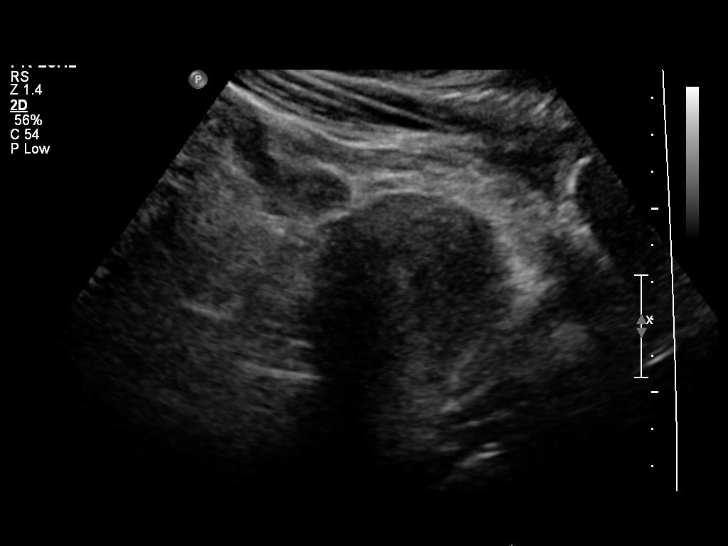
[im 9/53]
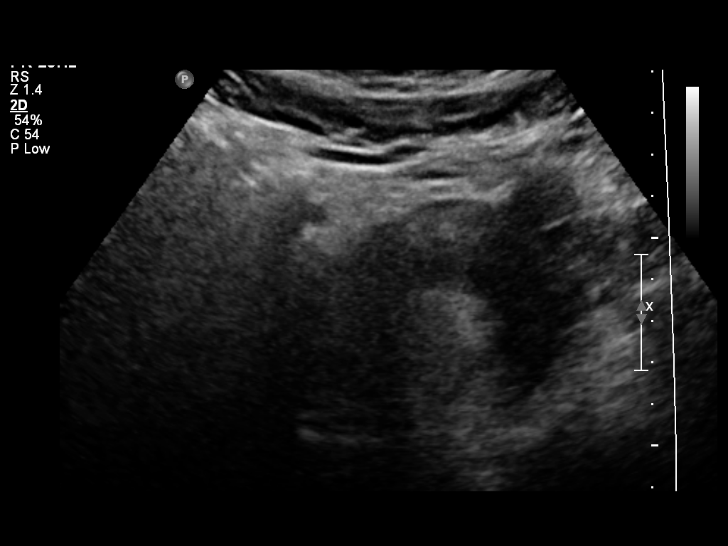
[im 14/53]
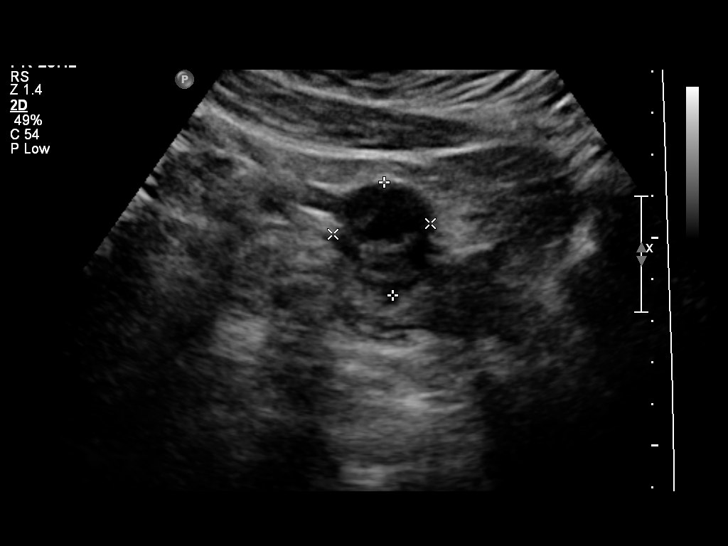
[im 18/53]
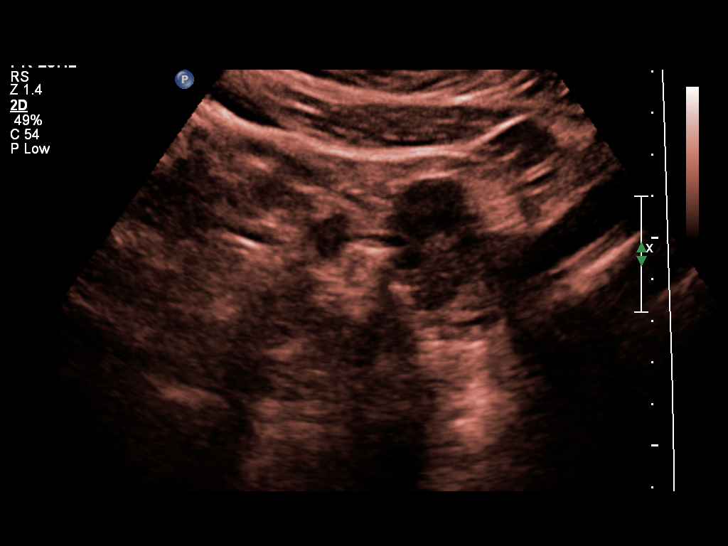
[im 20/53]
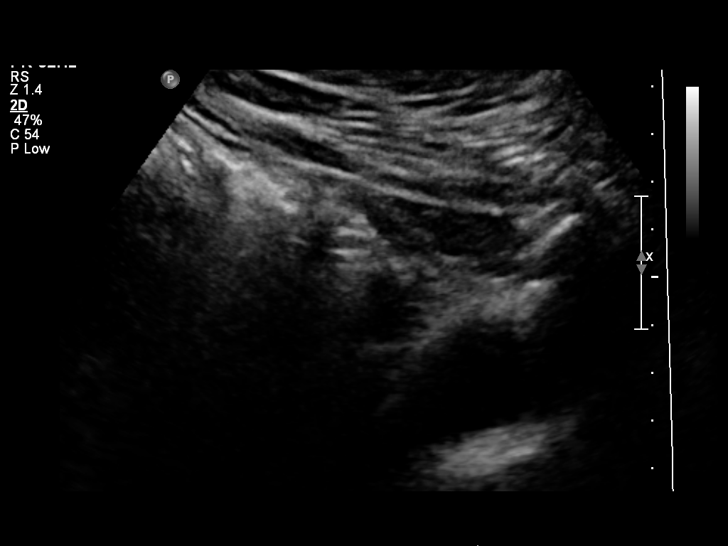
[im 24/53]
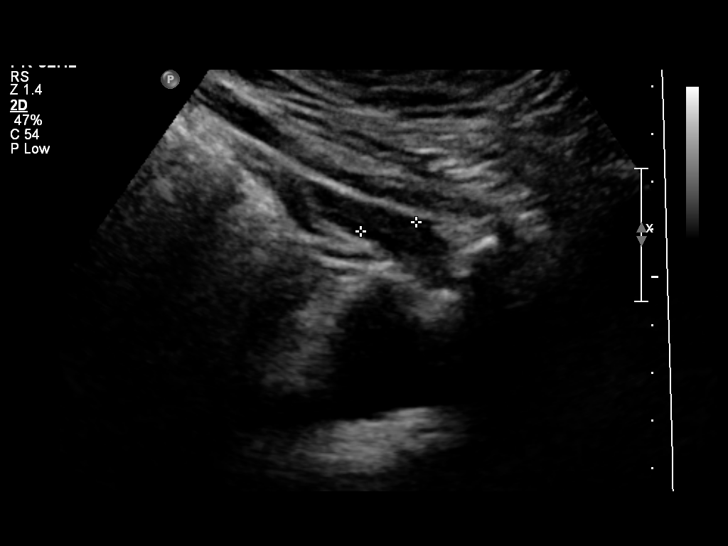
[im 29/53]
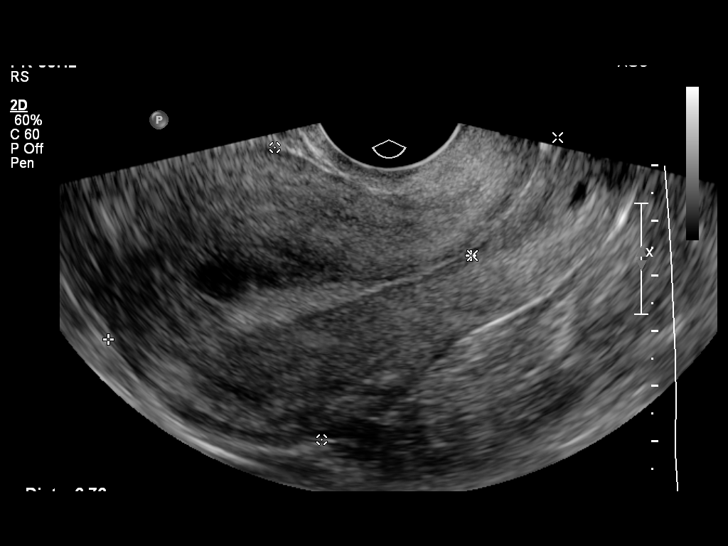
[im 33/53]
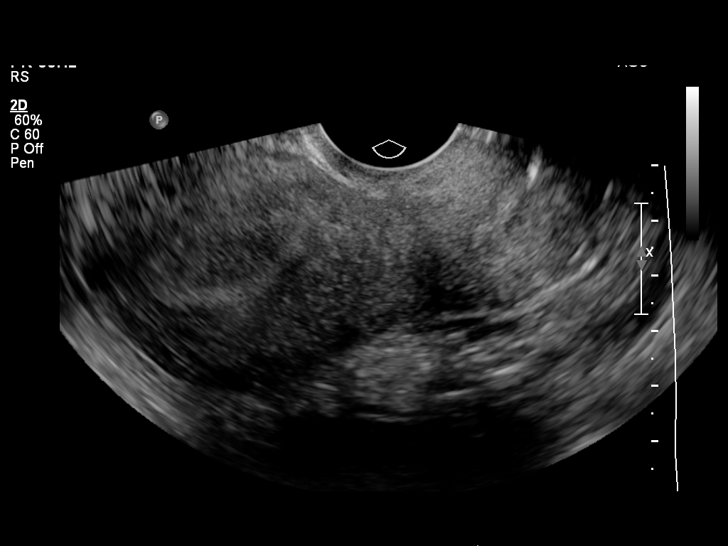
[im 35/53]
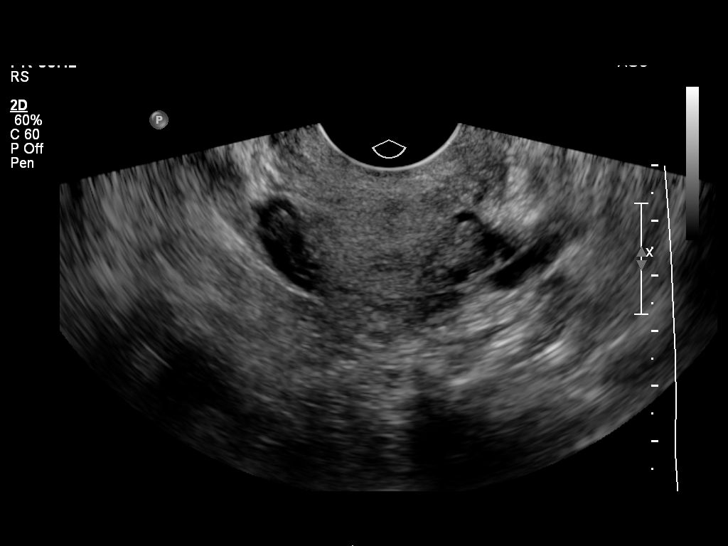
[im 40/53]
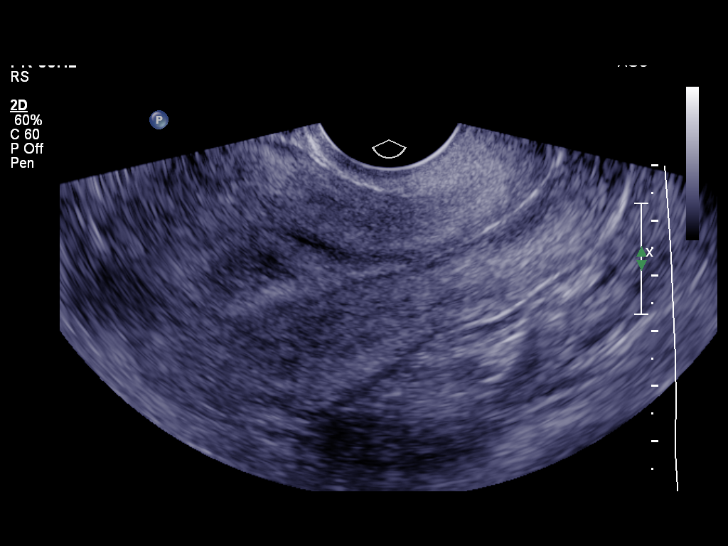
[im 44/53]
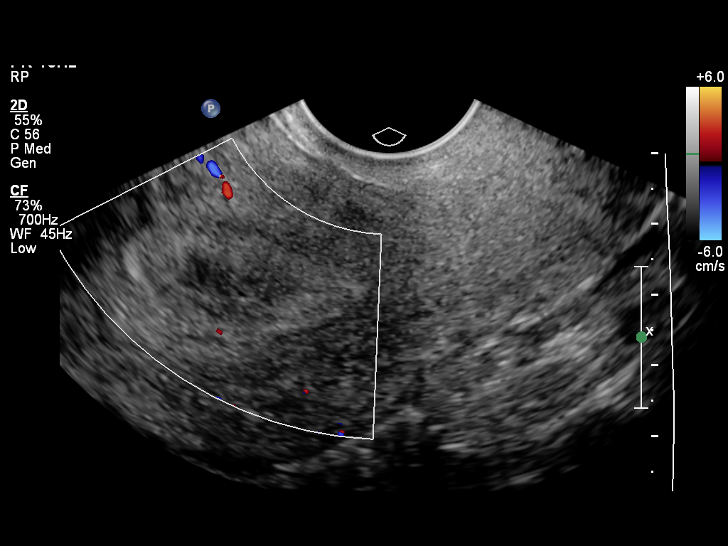
[im 48/53]
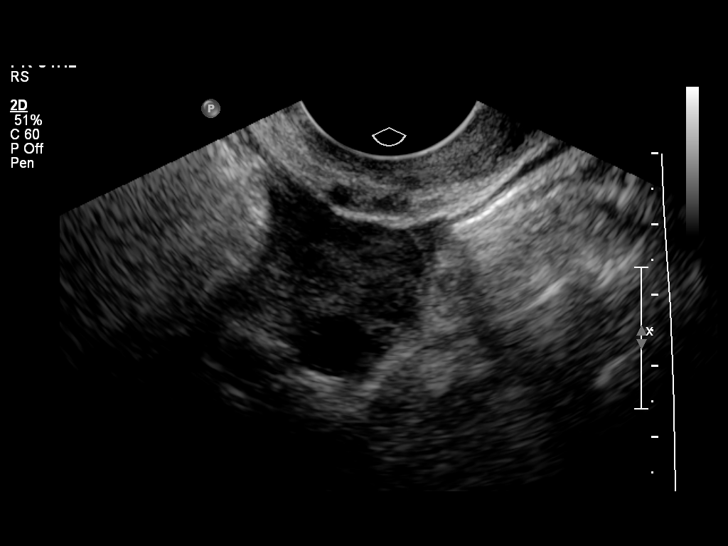
[im 53/53]
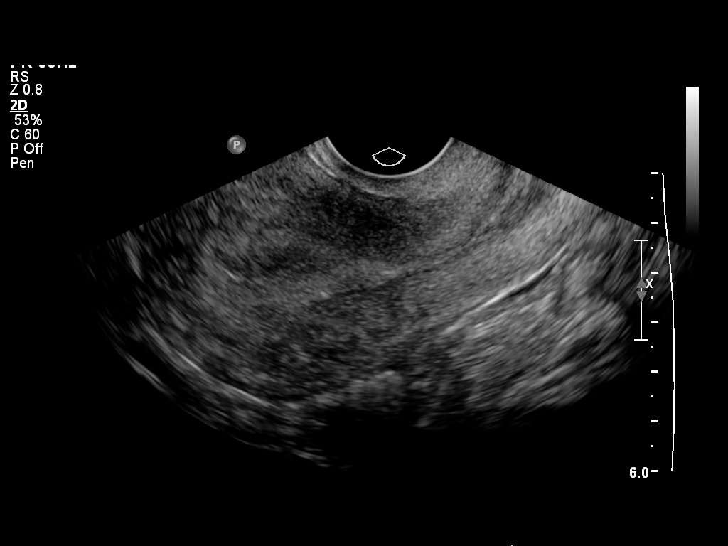

[14 of 25 positions shown; findings below may reference images not displayed]

FINDINGS: Uterus

Measurements: 9.4 x 5.4 x 6.1 cm. No fibroids or other mass
visualized.

Endometrium

Thickness: 15 mm. 11 x 7 x 14 mm hypoechoic lesion near the fundus,
without appreciable vascularity.

Right ovary

Measurements: 3.0 x 2.0 x 2.2 cm. Normal appearance/no adnexal mass.

Left ovary

Measurements: 2.7 x 2.3 x 2.1 cm. Normal appearance/no adnexal mass.

Other findings

Trace pelvic fluid.
IMPRESSION: 14 mm focal endometrial lesion near the uterine fundus, possibly
reflecting an endometrial polyp. Consider hysteroscopy or
endometrial sampling as clinically warranted.

Endometrial complex measures 15 mm.

## 2015-08-16 ENCOUNTER — Ambulatory Visit (INDEPENDENT_AMBULATORY_CARE_PROVIDER_SITE_OTHER): Payer: Medicaid Other

## 2015-08-16 ENCOUNTER — Ambulatory Visit (HOSPITAL_COMMUNITY)
Admission: EM | Admit: 2015-08-16 | Discharge: 2015-08-16 | Disposition: A | Discharge status: Home / Self Care | Payer: Medicaid Other | Attending: Family Medicine | Admitting: Family Medicine

## 2015-08-16 ENCOUNTER — Encounter (HOSPITAL_COMMUNITY): Payer: Self-pay | Admitting: Emergency Medicine

## 2015-08-16 DIAGNOSIS — S63501A Unspecified sprain of right wrist, initial encounter: Secondary | ICD-10-CM

## 2015-08-16 NOTE — Discharge Instructions (Signed)
Ice, splint and advil as needed, return if problems.

## 2015-08-16 NOTE — ED Notes (Signed)
Right hand pain, onset last night after falling in the shower.  Radial pulse 2 plus.  Fingers warm.  No numbness with movement of fingers, movement of wrist causes pain.

## 2015-08-16 NOTE — ED Provider Notes (Signed)
CSN: [id number]     Arrival date & time 08/16/15  1544 History   First MD Initiated Contact with Patient 08/16/15 1625     Chief Complaint  Patient presents with  . Hand Pain   (Consider location/radiation/quality/duration/timing/severity/associated sxs/prior Treatment) Patient is a 35 y.o. female presenting with wrist pain. The history is provided by the patient.  Wrist Pain This is a new problem. The current episode started 12 to 24 hours ago (slipped in shower onto right wrist.last eve.). The problem has been gradually worsening.    Past Medical History  Diagnosis Date  . Hypertension    Past Surgical History  Procedure Laterality Date  . Shoulder surgery     Family History  Problem Relation Age of Onset  . Diabetes Maternal Grandmother   . Kidney disease Maternal Grandmother   . Heart disease Maternal Grandmother    Social History  Substance Use Topics  . Smoking status: Current Every Day Smoker  . Smokeless tobacco: Never Used  . Alcohol Use: No   OB History    Gravida Para Term Preterm AB TAB SAB Ectopic Multiple Living   4    1   1  3      Review of Systems  Musculoskeletal: Positive for joint swelling. Negative for back pain and gait problem.  Skin: Negative.   All other systems reviewed and are negative.   Allergies  Aspirin  Home Medications   Prior to Admission medications   Medication Sig Start Date End Date Taking? Authorizing Provider  dicyclomine (BENTYL) 20 MG tablet Take 1 tablet (20 mg total) by mouth 3 (three) times daily before meals. 04/13/15   Brock Badharles A Harper, MD  hydrochlorothiazide (HYDRODIURIL) 25 MG tablet Take 25 mg by mouth daily.    Historical Provider, MD  ibuprofen (ADVIL,MOTRIN) 800 MG tablet Take 1 tablet (800 mg total) by mouth every 8 (eight) hours as needed. 04/02/15   Brock Badharles A Harper, MD  medroxyPROGESTERone (PROVERA) 10 MG tablet Take 1 tablet (10 mg total) by mouth daily. 04/13/15   Brock Badharles A Harper, MD   Meds Ordered and  Administered this Visit  Medications - No data to display  BP 130/73 mmHg  Pulse 71  Temp(Src) 98.3 F (36.8 C) (Oral)  Resp 16  SpO2 100%  LMP 07/28/2015 No data found.   Physical Exam  Constitutional: She appears well-developed and well-nourished. No distress.  Musculoskeletal: She exhibits tenderness.       Right wrist: She exhibits decreased range of motion, tenderness, bony tenderness, swelling and deformity. She exhibits no effusion, no crepitus and no laceration.       Arms: Neurological: She is alert.  Skin: Skin is warm and dry.  Nursing note and vitals reviewed.   ED Course  Procedures (including critical care time)  Labs Review Labs Reviewed - No data to display  Imaging Review Dg Wrist Complete Right  08/16/2015  CLINICAL DATA:  Acute right wrist pain after fall in shower last night. Initial encounter. EXAM: RIGHT WRIST - COMPLETE 3+ VIEW COMPARISON:  None. FINDINGS: There is no evidence of fracture or dislocation. There is no evidence of arthropathy or other focal bone abnormality. Soft tissues are unremarkable. IMPRESSION: Normal right wrist. Electronically Signed   By: Lupita RaiderJames  Green Jr, M.D.   On: 08/16/2015 16:48   X-rays reviewed and report per radiologist.   Visual Acuity Review  Right Eye Distance:   Left Eye Distance:   Bilateral Distance:    Right Eye Near:  Left Eye Near:    Bilateral Near:         MDM   1. Wrist sprain, right, initial encounter        Linna Hoff, MD 08/16/15 [phone]

## 2015-09-03 ENCOUNTER — Ambulatory Visit (INDEPENDENT_AMBULATORY_CARE_PROVIDER_SITE_OTHER): Payer: Medicaid Other | Admitting: Family Medicine

## 2015-09-03 ENCOUNTER — Encounter: Payer: Self-pay | Admitting: Family Medicine

## 2015-09-03 VITALS — BP 134/54 | HR 76 | Temp 98.2°F | Resp 16 | Ht 64.0 in | Wt 223.0 lb

## 2015-09-03 DIAGNOSIS — Z8679 Personal history of other diseases of the circulatory system: Secondary | ICD-10-CM

## 2015-09-03 DIAGNOSIS — N898 Other specified noninflammatory disorders of vagina: Secondary | ICD-10-CM | POA: Diagnosis not present

## 2015-09-03 DIAGNOSIS — F17200 Nicotine dependence, unspecified, uncomplicated: Secondary | ICD-10-CM | POA: Insufficient documentation

## 2015-09-03 DIAGNOSIS — Z202 Contact with and (suspected) exposure to infections with a predominantly sexual mode of transmission: Secondary | ICD-10-CM

## 2015-09-03 DIAGNOSIS — B9689 Other specified bacterial agents as the cause of diseases classified elsewhere: Secondary | ICD-10-CM

## 2015-09-03 DIAGNOSIS — A499 Bacterial infection, unspecified: Secondary | ICD-10-CM

## 2015-09-03 DIAGNOSIS — R7303 Prediabetes: Secondary | ICD-10-CM | POA: Diagnosis not present

## 2015-09-03 DIAGNOSIS — N921 Excessive and frequent menstruation with irregular cycle: Secondary | ICD-10-CM

## 2015-09-03 DIAGNOSIS — Z23 Encounter for immunization: Secondary | ICD-10-CM

## 2015-09-03 DIAGNOSIS — E669 Obesity, unspecified: Secondary | ICD-10-CM

## 2015-09-03 DIAGNOSIS — B379 Candidiasis, unspecified: Secondary | ICD-10-CM

## 2015-09-03 DIAGNOSIS — N760 Acute vaginitis: Secondary | ICD-10-CM

## 2015-09-03 LAB — COMPLETE METABOLIC PANEL WITH GFR
ALT: 7 U/L (ref 6–29)
AST: 13 U/L (ref 10–30)
Albumin: 3.8 g/dL (ref 3.6–5.1)
Alkaline Phosphatase: 57 U/L (ref 33–115)
BUN: 12 mg/dL (ref 7–25)
CALCIUM: 8.5 mg/dL — AB (ref 8.6–10.2)
CHLORIDE: 105 mmol/L (ref 98–110)
CO2: 25 mmol/L (ref 20–31)
Creat: 0.89 mg/dL (ref 0.50–1.10)
GFR, EST NON AFRICAN AMERICAN: 84 mL/min (ref >=60)
Glucose, Bld: 84 mg/dL (ref 65–99)
POTASSIUM: 4 mmol/L (ref 3.5–5.3)
Sodium: 137 mmol/L (ref 135–146)
Total Bilirubin: 0.4 mg/dL (ref 0.2–1.2)
Total Protein: 6.8 g/dL (ref 6.1–8.1)

## 2015-09-03 LAB — HIV ANTIBODY (ROUTINE TESTING W REFLEX): HIV 1&2 Ab, 4th Generation: NONREACTIVE

## 2015-09-03 LAB — HEMOGLOBIN A1C
HEMOGLOBIN A1C: 5.4 % (ref <5.7)
MEAN PLASMA GLUCOSE: 108 mg/dL

## 2015-09-03 LAB — TSH: TSH: 1.74 mIU/L

## 2015-09-03 MED ORDER — FLUCONAZOLE 150 MG PO TABS: 150.0000 mg | ORAL_TABLET | Freq: Once | ORAL | Status: DC

## 2015-09-03 MED ORDER — METRONIDAZOLE 500 MG PO TABS: 500.0000 mg | ORAL_TABLET | Freq: Two times a day (BID) | ORAL | Status: DC

## 2015-09-03 NOTE — Patient Instructions (Signed)
Bacterial Vaginosis Bacterial vaginosis is a vaginal infection that occurs when the normal balance of bacteria in the vagina is disrupted. It results from an overgrowth of certain bacteria. This is the most common vaginal infection in women of childbearing age. Treatment is important to prevent complications, especially in pregnant women, as it can cause a premature delivery. CAUSES  Bacterial vaginosis is caused by an increase in harmful bacteria that are normally present in smaller amounts in the vagina. Several different kinds of bacteria can cause bacterial vaginosis. However, the reason that the condition develops is not fully understood. RISK FACTORS Certain activities or behaviors can put you at an increased risk of developing bacterial vaginosis, including:  Having a new sex partner or multiple sex partners.  Douching.  Using an intrauterine device (IUD) for contraception. Women do not get bacterial vaginosis from toilet seats, bedding, swimming pools, or contact with objects around them. SIGNS AND SYMPTOMS  Some women with bacterial vaginosis have no signs or symptoms. Common symptoms include:  Grey vaginal discharge.  A fishlike odor with discharge, especially after sexual intercourse.  Itching or burning of the vagina and vulva.  Burning or pain with urination. DIAGNOSIS  Your health care provider will take a medical history and examine the vagina for signs of bacterial vaginosis. A sample of vaginal fluid may be taken. Your health care provider will look at this sample under a microscope to check for bacteria and abnormal cells. A vaginal pH test may also be done.  TREATMENT  Bacterial vaginosis may be treated with antibiotic medicines. These may be given in the form of a pill or a vaginal cream. A second round of antibiotics may be prescribed if the condition comes back after treatment. Because bacterial vaginosis increases your risk for sexually transmitted diseases, getting  treated can help reduce your risk for chlamydia, gonorrhea, HIV, and herpes. HOME CARE INSTRUCTIONS   Only take over-the-counter or prescription medicines as directed by your health care provider.  If antibiotic medicine was prescribed, take it as directed. Make sure you finish it even if you start to feel better.  Tell all sexual partners that you have a vaginal infection. They should see their health care provider and be treated if they have problems, such as a mild rash or itching.  During treatment, it is important that you follow these instructions:  Avoid sexual activity or use condoms correctly.  Do not douche.  Avoid alcohol as directed by your health care provider.  Avoid breastfeeding as directed by your health care provider. SEEK MEDICAL CARE IF:   Your symptoms are not improving after 3 days of treatment.  You have increased discharge or pain.  You have a fever. MAKE SURE YOU:   Understand these instructions.  Will watch your condition.  Will get help right away if you are not doing well or get worse. FOR MORE INFORMATION  Centers for Disease Control and Prevention, Division of STD Prevention: www.cdc.gov/std American Sexual Health Association (ASHA): www.ashastd.org    This information is not intended to replace advice given to you by your health care provider. Make sure you discuss any questions you have with your health care provider.   Document Released: 04/10/2005 Document Revised: 05/01/2014 Document Reviewed: 11/20/2012 Elsevier Interactive Patient Education 2016 Elsevier Inc. Monilial Vaginitis Vaginitis in a soreness, swelling and redness (inflammation) of the vagina and vulva. Monilial vaginitis is not a sexually transmitted infection. CAUSES  Yeast vaginitis is caused by yeast (candida) that is normally found in   your vagina. With a yeast infection, the candida has overgrown in number to a point that upsets the chemical balance. SYMPTOMS   White,  thick vaginal discharge.  Swelling, itching, redness and irritation of the vagina and possibly the lips of the vagina (vulva).  Burning or painful urination.  Painful intercourse. DIAGNOSIS  Things that may contribute to monilial vaginitis are:  Postmenopausal and virginal states.  Pregnancy.  Infections.  Being tired, sick or stressed, especially if you had monilial vaginitis in the past.  Diabetes. Good control will help lower the chance.  Birth control pills.  Tight fitting garments.  Using bubble bath, feminine sprays, douches or deodorant tampons.  Taking certain medications that kill germs (antibiotics).  Sporadic recurrence can occur if you become ill. TREATMENT  Your caregiver will give you medication.  There are several kinds of anti monilial vaginal creams and suppositories specific for monilial vaginitis. For recurrent yeast infections, use a suppository or cream in the vagina 2 times a week, or as directed.  Anti-monilial or steroid cream for the itching or irritation of the vulva may also be used. Get your caregiver's permission.  Painting the vagina with methylene blue solution may help if the monilial cream does not work.  Eating yogurt may help prevent monilial vaginitis. HOME CARE INSTRUCTIONS   Finish all medication as prescribed.  Do not have sex until treatment is completed or after your caregiver tells you it is okay.  Take warm sitz baths.  Do not douche.  Do not use tampons, especially scented ones.  Wear cotton underwear.  Avoid tight pants and panty hose.  Tell your sexual partner that you have a yeast infection. They should go to their caregiver if they have symptoms such as mild rash or itching.  Your sexual partner should be treated as well if your infection is difficult to eliminate.  Practice safer sex. Use condoms.  Some vaginal medications cause latex condoms to fail. Vaginal medications that harm condoms are:  Cleocin  cream.  Butoconazole (Femstat).  Terconazole (Terazol) vaginal suppository.  Miconazole (Monistat) (may be purchased over the counter). SEEK MEDICAL CARE IF:   You have a temperature by mouth above 102 F (38.9 C).  The infection is getting worse after 2 days of treatment.  The infection is not getting better after 3 days of treatment.  You develop blisters in or around your vagina.  You develop vaginal bleeding, and it is not your menstrual period.  You have pain when you urinate.  You develop intestinal problems.  You have pain with sexual intercourse.   This information is not intended to replace advice given to you by your health care provider. Make sure you discuss any questions you have with your health care provider.   Document Released: 01/18/2005 Document Revised: 07/03/2011 Document Reviewed: 10/12/2014 Elsevier Interactive Patient Education 2016 Elsevier Inc.  

## 2015-09-03 NOTE — Progress Notes (Signed)
Subjective:    Patient ID: Ashley Morton, female    DOB: [birth year], 35 y.o.   MRN: [id number]  HPI  Ashley Morton, a 35 year old female presents to establish care. She says that she was a patient of [facility] in [place], [place], but has been lost to follow-up. She says that she has primarily been using the emergency department for all primary needs. Ashley Morton is complaining of heavy menstrual periods, bleeding with vaginal intercourse, and vaginal discharge. Patient maintains that she was followed by Dr. Clearance Coots, gynecologist for abnormal uterine bleeding. She says that everything was normal during previous gynecological visit.  She is also complaining of thick, white vaginal discharge. She says that she has not been using barrier protection with sexual intercourse. She also complains of vaginal itching. She denies fever, fatigue, nausea, vomiting or diarrhea.   Patient also has a history of hypertension. She was previously on anti-hypertensive medications. She says that blood pressure has been controlled since losing 20 pounds. She follows a lowfat, low sodium diet. She does not exercise or check blood pressure at home. She is an everyday tobacco user. She has been smoking 1 pack per day. She is not ready to quit. Her cardiovascular risk factors include: sedentary lifestyle, obesity, and tobacco use.   Past Medical History  Diagnosis Date  . Hypertension    Immunization History  Administered Date(s) Administered  . Pneumococcal Polysaccharide-23 09/03/2015   Social History   Social History  . Marital Status: Single    Spouse Name: N/A  . Number of Children: N/A  . Years of Education: N/A   Occupational History  . Not on file.   Social History Main Topics  . Smoking status: Current Every Day Smoker -- 1.00 packs/day    Types: Cigarettes  . Smokeless tobacco: Never Used  . Alcohol Use: No  . Drug Use: No  . Sexual Activity:    Partners: Male    Birth  Control/ Protection: None   Other Topics Concern  . Not on file   Social History Narrative   Allergies  Allergen Reactions  . Aspirin Other (See Comments)    States she cannot take the taste of the ASA even the chewable   Review of Systems  Constitutional: Negative.  Negative for fever and fatigue.  HENT: Negative.   Eyes: Negative.   Respiratory: Negative.   Cardiovascular: Negative.   Gastrointestinal: Negative.  Negative for abdominal pain.  Endocrine: Negative.   Genitourinary: Positive for vaginal discharge (white discharge) and menstrual problem (abnormal urterine bleeding). Negative for dysuria, frequency and hematuria.  Musculoskeletal: Negative.  Negative for back pain.  Allergic/Immunologic: Negative.  Negative for immunocompromised state.  Neurological: Negative.  Negative for dizziness.  Hematological: Negative.   Psychiatric/Behavioral: Negative.  Negative for agitation.       Objective:   Physical Exam  Constitutional: She is oriented to person, place, and time. She appears well-developed and well-nourished.  HENT:  Head: Normocephalic and atraumatic.  Right Ear: External ear normal.  Left Ear: External ear normal.  Mouth/Throat: Oropharynx is clear and moist.  Eyes: Conjunctivae and EOM are normal. Pupils are equal, round, and reactive to light.  Neck: Normal range of motion. Neck supple.  Cardiovascular: Normal rate, regular rhythm, normal heart sounds and intact distal pulses.   Pulmonary/Chest: Effort normal and breath sounds normal.  Abdominal: Soft. Bowel sounds are normal.  Genitourinary: There is no rash, tenderness or lesion on the right labia. There is no  rash, tenderness or lesion on the left labia. Uterus is not tender. Cervix exhibits discharge (white, thick). Cervix exhibits no motion tenderness and no friability. No tenderness or bleeding in the vagina. Vaginal discharge found.  Musculoskeletal: Normal range of motion.  Neurological: She is  alert and oriented to person, place, and time. She has normal reflexes.  Skin: Skin is warm and dry.  Psychiatric: She has a normal mood and affect. Her behavior is normal. Judgment and thought content normal.      BP 134/54 mmHg  Pulse 76  Temp(Src) 98.2 F (36.8 C) (Oral)  Resp 16  Ht 5\' 4"  (1.626 m)  Wt 223 lb (101.152 kg)  BMI 38.26 kg/m2  LMP 08/23/2015 Assessment & Plan:  1. Obesity Recommend a lowfat, low carbohydrate diet divided over 5-6 small meals, increase water intake to 6-8 glasses, and 150 minutes per week of cardiovascular exercise.   - TSH  2. Vaginal discharge - POCT Wet Prep [facility] [place] - Pelvic exam; Future  3. History of high blood pressure The patient is asked to make an attempt to improve diet and exercise patterns to aid in medical management of this problem.  4. Prediabetes - Hemoglobin A1c - COMPLETE METABOLIC PANEL WITH GFR  5. Possible exposure to STD Reminded patient of the importance of barrier protection with sexual intercourse - RPR - GC/Chlamydia Probe Amp - HIV antibody (with reflex)  6. Menorrhagia with irregular cycle I will defer to gynecology for further work-up and evaluation. Reviewed previous pelvic/transvaginal ultrasound.  - Ambulatory referral to Gynecology - Pelvic exam; Future  7. Bacterial vaginosis 3-5 Clue cells per HPF on wetprep - metroNIDAZOLE (FLAGYL) 500 MG tablet; Take 1 tablet (500 mg total) by mouth 2 (two) times daily.  Dispense: 14 tablet; Refill: 0  8. Yeast infection Yeast present on wetprep - fluconazole (DIFLUCAN) 150 MG tablet; Take 1 tablet (150 mg total) by mouth once.  Dispense: 1 tablet; Refill: 0  9. Immunization due - Pneumococcal polysaccharide vaccine 23-valent greater than or equal to 2yo subcutaneous/IM  10. Tobacco dependence Smoking cessation instruction/counseling given:  counseled patient on the dangers of tobacco use, advised patient to stop smoking, and reviewed strategies to maximize  success    RTC: Will follow up by phone with laboratory results. Will schedule follow up appt at that time [facility],Duru Reiger M, FNP    The patient was given clear instructions to go to ER or return to medical center if symptoms do not improve, worsen or new problems develop. The patient verbalized understanding. Will notify patient with laboratory results.

## 2015-09-04 LAB — GC/CHLAMYDIA PROBE AMP
CT PROBE, AMP APTIMA: NOT DETECTED
GC PROBE AMP APTIMA: NOT DETECTED

## 2015-09-04 LAB — RPR

## 2015-09-10 ENCOUNTER — Other Ambulatory Visit: Payer: Self-pay | Admitting: Family Medicine

## 2015-09-10 ENCOUNTER — Other Ambulatory Visit: Payer: Medicaid Other

## 2015-09-10 DIAGNOSIS — E669 Obesity, unspecified: Secondary | ICD-10-CM

## 2015-09-17 ENCOUNTER — Other Ambulatory Visit: Payer: Medicaid Other

## 2015-10-16 IMAGING — DX DG CHEST 2V
2 series · 2 of 2 positions shown · non-contrast
Comparison: 11/13/2014

CLINICAL DATA: Left-sided chest pain

EXAM:
CHEST  2 VIEW

[chest pa]
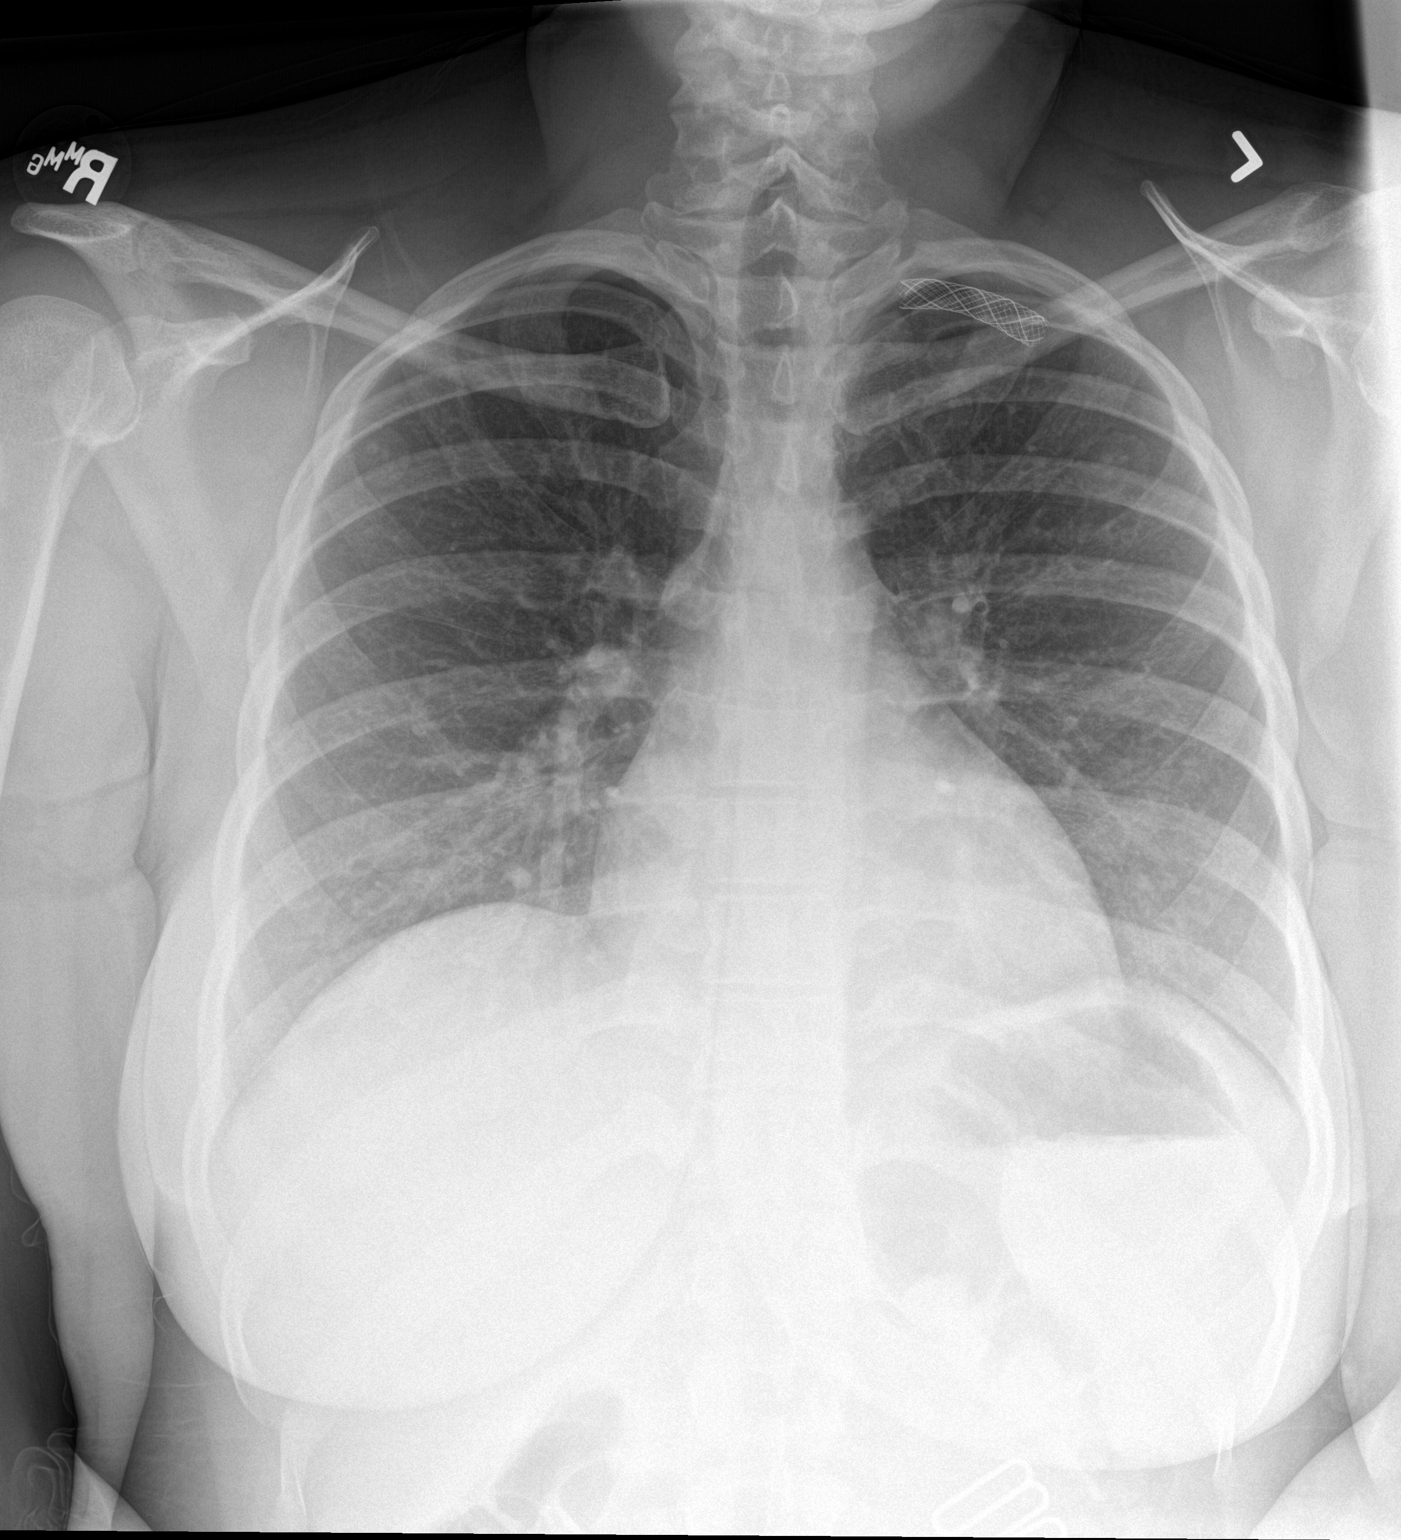

[chest lat]
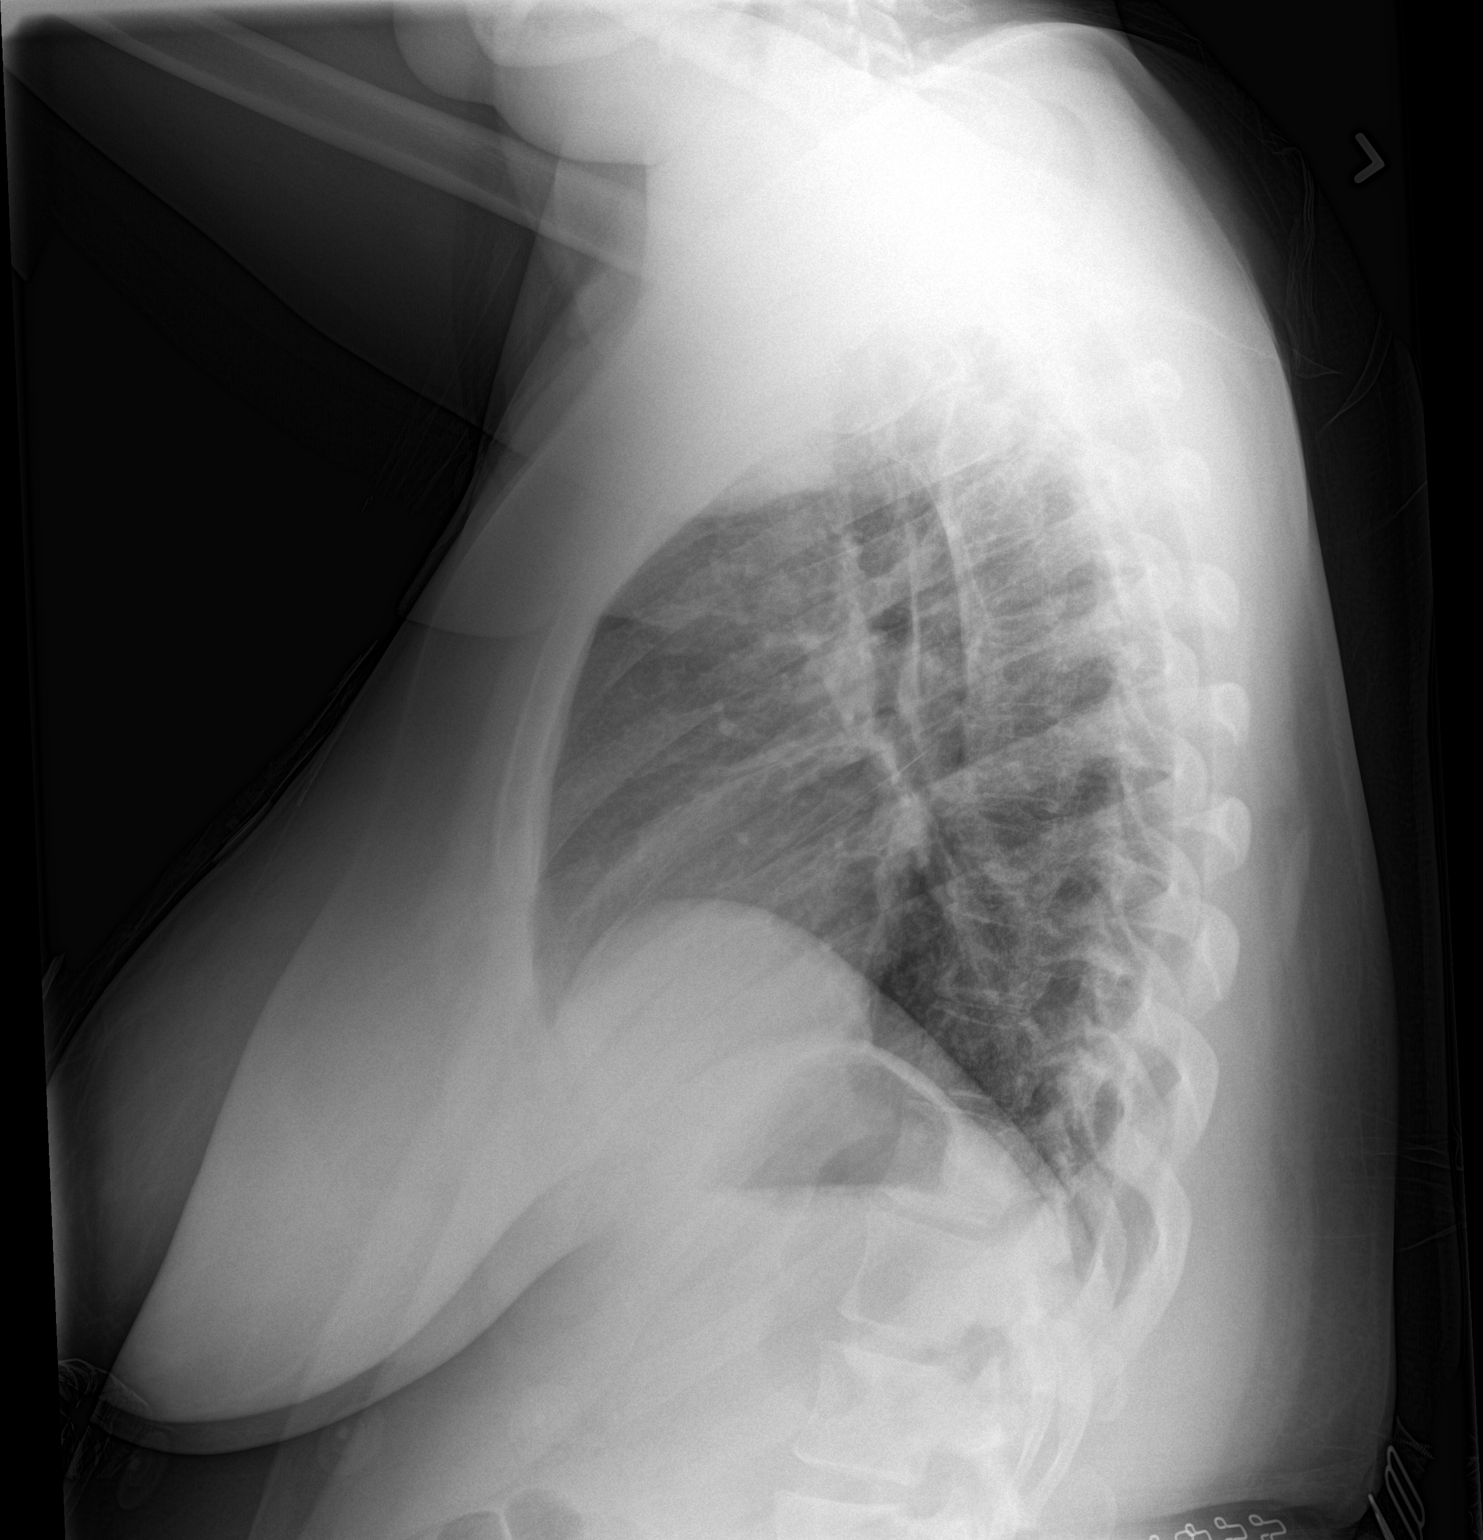

[2 of 2 positions shown; findings below may reference images not displayed]

FINDINGS: Normal heart size and mediastinal contours. Left subclavian vascular
stent noted. No acute infiltrate or edema. No effusion or
pneumothorax. No acute osseous findings.
IMPRESSION: Negative chest.

## 2015-10-18 ENCOUNTER — Encounter: Payer: Self-pay | Admitting: Obstetrics & Gynecology

## 2015-10-18 ENCOUNTER — Ambulatory Visit (INDEPENDENT_AMBULATORY_CARE_PROVIDER_SITE_OTHER): Payer: Medicaid Other | Admitting: Obstetrics & Gynecology

## 2015-10-18 VITALS — BP 151/85 | HR 64 | Wt 218.1 lb

## 2015-10-18 DIAGNOSIS — N939 Abnormal uterine and vaginal bleeding, unspecified: Secondary | ICD-10-CM | POA: Diagnosis present

## 2015-10-18 MED ORDER — MEGESTROL ACETATE 40 MG PO TABS: ORAL_TABLET | ORAL | Status: DC

## 2015-10-18 MED ORDER — IBUPROFEN 800 MG PO TABS: 800.0000 mg | ORAL_TABLET | Freq: Three times a day (TID) | ORAL | Status: DC | PRN

## 2015-10-18 NOTE — Patient Instructions (Addendum)
Anovulatory Bleeding   Dysfunctional Uterine Bleeding Dysfunctional uterine bleeding is abnormal bleeding from the uterus. Dysfunctional uterine bleeding includes:  A period that comes earlier or later than usual.  A period that is lighter, heavier, or has blood clots.  Bleeding between periods.  Skipping one or more periods.  Bleeding after sexual intercourse.  Bleeding after menopause. HOME CARE INSTRUCTIONS  Pay attention to any changes in your symptoms. Follow these instructions to help with your condition: Eating  Eat well-balanced meals. Include foods that are high in iron, such as liver, meat, shellfish, green leafy vegetables, and eggs.  If you become constipated:  Drink plenty of water.  Eat fruits and vegetables that are high in water and fiber, such as spinach, carrots, raspberries, apples, and mango. Medicines  Take over-the-counter and prescription medicines only as told by your health care provider.  Do not change medicines without talking with your health care provider.  Aspirin or medicines that contain aspirin may make the bleeding worse. Do not take those medicines:  During the week before your period.  During your period.  If you were prescribed iron pills, take them as told by your health care provider. Iron pills help to replace iron that your body loses because of this condition. Activity  If you need to change your sanitary pad or tampon more than one time every 2 hours:  Lie in bed with your feet raised (elevated).  Place a cold pack on your lower abdomen.  Rest as much as possible until the bleeding stops or slows down.  Do not try to lose weight until the bleeding has stopped and your blood iron level is back to normal. Other Instructions  For two months, write down:  When your period starts.  When your period ends.  When any abnormal bleeding occurs.  What problems you notice.  Keep all follow up visits as told by your health  care provider. This is important. SEEK MEDICAL CARE IF:  You get light-headed or weak.  You have nausea and vomiting.  You cannot eat or drink without vomiting.  You feel dizzy or have diarrhea while you are taking medicines.  You are taking birth control pills or hormones, and you want to change them or stop taking them. SEEK IMMEDIATE MEDICAL CARE IF:  You develop a fever or chills.  You need to change your sanitary pad or tampon more than one time per hour.  Your bleeding becomes heavier, or your flow contains clots more often.  You develop pain in your abdomen.  You lose consciousness.  You develop a rash.   This information is not intended to replace advice given to you by your health care provider. Make sure you discuss any questions you have with your health care provider.   Document Released: 04/07/2000 Document Revised: 12/30/2014 Document Reviewed: 07/06/2014 Elsevier Interactive Patient Education 2016 Elsevier Inc.   Abnormal Uterine Bleeding Abnormal uterine bleeding can affect women at various stages in life, including teenagers, women in their reproductive years, pregnant women, and women who have reached menopause. Several kinds of uterine bleeding are considered abnormal, including:  Bleeding or spotting between periods.   Bleeding after sexual intercourse.   Bleeding that is heavier or more than normal.   Periods that last longer than usual.  Bleeding after menopause.  Many cases of abnormal uterine bleeding are minor and simple to treat, while others are more serious. Any type of abnormal bleeding should be evaluated by your health care provider. Treatment  will depend on the cause of the bleeding. HOME CARE INSTRUCTIONS Monitor your condition for any changes. The following actions may help to alleviate any discomfort you are experiencing:  Avoid the use of tampons and douches as directed by your health care provider.  Change your pads  frequently. You should get regular pelvic exams and Pap tests. Keep all follow-up appointments for diagnostic tests as directed by your health care provider.  SEEK MEDICAL CARE IF:   Your bleeding lasts more than 1 week.   You feel dizzy at times.  SEEK IMMEDIATE MEDICAL CARE IF:   You pass out.   You are changing pads every 15 to 30 minutes.   You have abdominal pain.  You have a fever.   You become sweaty or weak.   You are passing large blood clots from the vagina.   You start to feel nauseous and vomit. MAKE SURE YOU:   Understand these instructions.  Will watch your condition.  Will get help right away if you are not doing well or get worse.   This information is not intended to replace advice given to you by your health care provider. Make sure you discuss any questions you have with your health care provider.   Document Released: 04/10/2005 Document Revised: 04/15/2013 Document Reviewed: 11/07/2012 Elsevier Interactive Patient Education [employer] Elsevier Inc.

## 2015-10-18 NOTE — Progress Notes (Signed)
CLINIC ENCOUNTER NOTE  History:  35 y.o. [id number] here today for second opinion about management of AUB. This has been going on for two years characterized by heavy menstrual bleeding for several days, than occurs about twice every month.  Also has increased postcoital bleeding. Was being evaluated by Dr. Clearance CootsHarper, had several normal ultrasounds (last one on 04/12/2015), normal pap, benign HSG and endometrial biopsy.  Was placed on Provera 10 mg daily, she stopped this because it did not help. She is very frustrated.  She denies any abnormal vaginal discharge, pelvic pain or other concerns.   Past Medical History  Diagnosis Date  . Hypertension     Past Surgical History  Procedure Laterality Date  . Shoulder surgery      The following portions of the patient's history were reviewed and updated as appropriate: allergies, current medications, past family history, past medical history, past social history, past surgical history and problem list.   Health Maintenance:  Normal pap and negative HRHPV on 11/06/14.    Review of Systems:  Pertinent items noted in HPI and remainder of comprehensive ROS otherwise negative.  Objective:  Physical Exam BP 151/85 mmHg  Pulse 64  Wt 218 lb 1.6 oz (98.93 kg)  LMP 10/16/2015 CONSTITUTIONAL: Well-developed, well-nourished female in no acute distress.  HENT:  Normocephalic, atraumatic. External right and left ear normal. Oropharynx is clear and moist EYES: Conjunctivae and EOM are normal. Pupils are equal, round, and reactive to light. No scleral icterus.  NECK: Normal range of motion, supple, no masses SKIN: Skin is warm and dry. No rash noted. Not diaphoretic. No erythema. No pallor. NEUROLOGIC: Alert and oriented to person, place, and time. Normal reflexes, muscle tone coordination. No cranial nerve deficit noted. PSYCHIATRIC: Normal mood and affect. Normal behavior. Normal judgment and thought content. CARDIOVASCULAR: Normal heart rate  noted RESPIRATORY: Effort and breath sounds normal, no problems with respiration noted ABDOMEN: Soft, no distention noted.   PELVIC: Normal appearing external genitalia; normal appearing vaginal mucosa and cervix.  Large amount of blood in vagina. No cervical ectropion visualized, no other lesions seen.  Normal uterine size, no other palpable masses, no uterine or adnexal tenderness. MUSCULOSKELETAL: Normal range of motion. No edema noted.  Labs and Imaging Please refer to ultrasound reports in 04/12/2015  Assessment & Plan:  Abnormal uterine bleeding (AUB) Discussed non-surgical management of AUB as she desires future fertility.  Discussed that she had extensive workup already, diagnosis is anovulatory bleeding. She reports she has already lost weight; does not want hormonal intervention but she was told this is her option for now.  Estrogen not indicated given her HTN, habitus and smoking history. Encouraged further weight loss and trial of oral progestin for now. If works, will recommend IUD. Ibuprofen also prescribed. - megestrol (MEGACE) 40 MG tablet; Take two tablets twice daily until bleeding stops, then one tablet twice daily.  Dispense: 90 tablet; Refill: 3 - ibuprofen (ADVIL,MOTRIN) 800 MG tablet; Take 1 tablet (800 mg total) by mouth 3 (three) times daily with meals as needed for headache or moderate pain.  Dispense: 30 tablet; Refill: 3 Routine preventative health maintenance measures emphasized. Please refer to After Visit Summary for other counseling recommendations.   Return in about 1 month (around 11/17/2015) for AUB follow up.   Total face-to-face time with patient: 30 minutes. Over 50% of encounter was spent on counseling and coordination of care.   Jaynie CollinsUGONNA  ANYANWU, MD, FACOG Attending Obstetrician & Gynecologist, [facility]

## 2015-10-29 ENCOUNTER — Encounter (HOSPITAL_COMMUNITY): Payer: Self-pay | Admitting: Emergency Medicine

## 2015-10-29 ENCOUNTER — Ambulatory Visit (HOSPITAL_COMMUNITY)
Admission: EM | Admit: 2015-10-29 | Discharge: 2015-10-29 | Disposition: A | Discharge status: Home / Self Care | Payer: Medicaid Other | Attending: Family Medicine | Admitting: Family Medicine

## 2015-10-29 DIAGNOSIS — T148 Other injury of unspecified body region: Secondary | ICD-10-CM

## 2015-10-29 DIAGNOSIS — W57XXXA Bitten or stung by nonvenomous insect and other nonvenomous arthropods, initial encounter: Secondary | ICD-10-CM

## 2015-10-29 MED ORDER — TRIAMCINOLONE ACETONIDE 0.1 % EX CREA: 1.0000 "application " | TOPICAL_CREAM | Freq: Two times a day (BID) | CUTANEOUS | Status: DC

## 2015-10-29 NOTE — ED Notes (Signed)
C/O RASH ON RIGHT LEG STATES RASH HAS SPREADED  [facility]

## 2015-10-29 NOTE — Discharge Instructions (Signed)
Apply the triamcinolone cream to the areas of insect bites twice a day. He may also apply Benadryl cream or gel to the same areas every 4 hours as needed. Do not apply the creams at the same time.

## 2015-10-29 NOTE — ED Provider Notes (Signed)
CSN: [id number]     Arrival date & time 10/29/15  1406 History   First MD Initiated Contact with Patient 10/29/15 1523     Chief Complaint  Patient presents with  . Rash   (Consider location/radiation/quality/duration/timing/severity/associated sxs/prior Treatment) HPI Comments: 35 year old female with several small papules to the right lower extremity. Mostly group to the right thigh and right lower back. She describes burning and itching sensation associated with these apparent insect bites. Denies systemic symptoms.   Past Medical History  Diagnosis Date  . Hypertension    Past Surgical History  Procedure Laterality Date  . Shoulder surgery     Family History  Problem Relation Age of Onset  . Diabetes Maternal Grandmother   . Kidney disease Maternal Grandmother   . Heart disease Maternal Grandmother    Social History  Substance Use Topics  . Smoking status: Current Every Day Smoker -- 1.00 packs/day    Types: Cigarettes  . Smokeless tobacco: Never Used  . Alcohol Use: No   OB History    Gravida Para Term Preterm AB TAB SAB Ectopic Multiple Living   4    1   1  3      Review of Systems  Constitutional: Negative.   HENT: Negative.   Respiratory: Negative.   Cardiovascular: Negative for chest pain and leg swelling.  Skin:       As per history of present illness No urticaria.  Neurological: Negative.   All other systems reviewed and are negative.   Allergies  Aspirin  Home Medications   Prior to Admission medications   Medication Sig Start Date End Date Taking? Authorizing Provider  ibuprofen (ADVIL,MOTRIN) 800 MG tablet Take 1 tablet (800 mg total) by mouth 3 (three) times daily with meals as needed for headache or moderate pain. 10/18/15   Tereso NewcomerUgonna A Anyanwu, MD  megestrol (MEGACE) 40 MG tablet Take two tablets twice daily until bleeding stops, then one tablet twice daily. 10/18/15   Tereso NewcomerUgonna A Anyanwu, MD  triamcinolone cream (KENALOG) 0.1 % Apply 1 application  topically 2 (two) times daily. 10/29/15   Hayden Rasmussenavid Kyshon Tolliver, NP   Meds Ordered and Administered this Visit  Medications - No data to display  BP 155/80 mmHg  Pulse 76  Temp(Src) 97.6 F (36.4 C) (Oral)  Resp 16  SpO2 100%  LMP 10/16/2015 No data found.   Physical Exam  Constitutional: She is oriented to person, place, and time. She appears well-developed and well-nourished. No distress.  Eyes: EOM are normal.  Neck: Normal range of motion. Neck supple.  Cardiovascular: Normal rate.   Pulmonary/Chest: Effort normal. No respiratory distress.  Musculoskeletal: She exhibits no edema.  Neurological: She is alert and oriented to person, place, and time. She exhibits normal muscle tone.  Skin: Skin is warm and dry.  Small pruritic papules to the right lower extremity. Some of these lesions occur in a linear fashion. No evidence of infection or cellulitis. No swelling.  Psychiatric: She has a normal mood and affect.  Nursing note and vitals reviewed.   ED Course  Procedures (including critical care time)  Labs Review Labs Reviewed - No data to display  Imaging Review No results found.   Visual Acuity Review  Right Eye Distance:   Left Eye Distance:   Bilateral Distance:    Right Eye Near:   Left Eye Near:    Bilateral Near:         MDM   1. Insect bite    pply  the triamcinolone cream to the areas of insect bites twice a day. He may also apply Benadryl cream or gel to the same areas every 4 hours as needed. Do not apply the creams at the same time. Meds ordered this encounter  Medications  . triamcinolone cream (KENALOG) 0.1 %    Sig: Apply 1 application topically 2 (two) times daily.    Dispense:  30 g    Refill:  0    Order Specific Question:  Supervising Provider    Answer:  Linna HoffKINDL, JAMES D [[id number]]       Hayden Rasmussenavid Kervens Roper, NP 10/29/15 [phone]

## 2015-11-08 ENCOUNTER — Encounter (HOSPITAL_COMMUNITY): Payer: Self-pay | Admitting: Emergency Medicine

## 2015-11-08 DIAGNOSIS — M79661 Pain in right lower leg: Secondary | ICD-10-CM | POA: Diagnosis not present

## 2015-11-08 DIAGNOSIS — M79662 Pain in left lower leg: Secondary | ICD-10-CM | POA: Insufficient documentation

## 2015-11-08 DIAGNOSIS — F17210 Nicotine dependence, cigarettes, uncomplicated: Secondary | ICD-10-CM | POA: Insufficient documentation

## 2015-11-08 DIAGNOSIS — E119 Type 2 diabetes mellitus without complications: Secondary | ICD-10-CM | POA: Insufficient documentation

## 2015-11-08 DIAGNOSIS — I10 Essential (primary) hypertension: Secondary | ICD-10-CM | POA: Insufficient documentation

## 2015-11-08 NOTE — ED Notes (Signed)
Pt. reports bilateral lower legs cramps/spasms onset last night , denies injury , no fever or chills. Ambulatory .

## 2015-11-09 ENCOUNTER — Emergency Department (HOSPITAL_COMMUNITY)
Admission: EM | Admit: 2015-11-09 | Discharge: 2015-11-09 | Disposition: A | Discharge status: Home / Self Care | Payer: Medicaid Other | Attending: Emergency Medicine | Admitting: Emergency Medicine

## 2015-11-09 DIAGNOSIS — R252 Cramp and spasm: Secondary | ICD-10-CM

## 2015-11-09 LAB — CBC WITH DIFFERENTIAL/PLATELET
BASOS PCT: 0 %
Basophils Absolute: 0 10*3/uL (ref 0.0–0.1)
EOS ABS: 0.2 10*3/uL (ref 0.0–0.7)
EOS PCT: 2 %
HCT: 34.1 % — ABNORMAL LOW (ref 36.0–46.0)
Hemoglobin: 10.8 g/dL — ABNORMAL LOW (ref 12.0–15.0)
LYMPHS ABS: 3.2 10*3/uL (ref 0.7–4.0)
Lymphocytes Relative: 31 %
MCH: 26.3 pg (ref 26.0–34.0)
MCHC: 31.7 g/dL (ref 30.0–36.0)
MCV: 83 fL (ref 78.0–100.0)
MONOS PCT: 11 %
Monocytes Absolute: 1.1 10*3/uL — ABNORMAL HIGH (ref 0.1–1.0)
NEUTROS PCT: 56 %
Neutro Abs: 5.9 10*3/uL (ref 1.7–7.7)
PLATELETS: 265 10*3/uL (ref 150–400)
RBC: 4.11 MIL/uL (ref 3.87–5.11)
RDW: 17.2 % — ABNORMAL HIGH (ref 11.5–15.5)
WBC: 10.4 10*3/uL (ref 4.0–10.5)

## 2015-11-09 LAB — BASIC METABOLIC PANEL
Anion gap: 8 (ref 5–15)
BUN: 6 mg/dL (ref 6–20)
CO2: 25 mmol/L (ref 22–32)
CREATININE: 0.83 mg/dL (ref 0.44–1.00)
Calcium: 9 mg/dL (ref 8.9–10.3)
Chloride: 107 mmol/L (ref 101–111)
Glucose, Bld: 114 mg/dL — ABNORMAL HIGH (ref 65–99)
Potassium: 3.5 mmol/L (ref 3.5–5.1)
SODIUM: 140 mmol/L (ref 135–145)

## 2015-11-09 MED ORDER — METHOCARBAMOL 500 MG PO TABS: 500.0000 mg | ORAL_TABLET | Freq: Three times a day (TID) | ORAL | Status: DC | PRN

## 2015-11-09 NOTE — ED Notes (Signed)
Shari Upstill, PA at bedside at this time.  

## 2015-11-09 NOTE — ED Notes (Signed)
Patient removed blood pressure cuff as soon as blood pressure was taken in room.

## 2015-11-09 NOTE — ED Provider Notes (Signed)
CSN: [id number]     Arrival date & time 11/08/15  2327 History   First MD Initiated Contact with Patient 11/09/15 [phone]     Chief Complaint  Patient presents with  . Leg Pain     (Consider location/radiation/quality/duration/timing/severity/associated sxs/prior Treatment) Patient is a 35 y.o. female presenting with leg pain. The history is provided by the patient. No language interpreter was used.  Leg Pain Location:  Leg Time since incident:  2 days Injury: no   Leg location:  L lower leg and R lower leg Pain details:    Quality:  Cramping   Severity:  Moderate   Timing:  Intermittent Chronicity:  New Dislocation: no   Foreign body present:  No foreign bodies Ineffective treatments:  NSAIDs Associated symptoms: no fever   Associated symptoms comment:  Patient presents with intermittent cramping of calves bilaterally since yesterday. No swelling, fever, injury. No thigh or foot pain. No CP, SOB.   Past Medical History  Diagnosis Date  . Hypertension   . Diabetes mellitus without complication [facility])    Past Surgical History  Procedure Laterality Date  . Shoulder surgery     Family History  Problem Relation Age of Onset  . Diabetes Maternal Grandmother   . Kidney disease Maternal Grandmother   . Heart disease Maternal Grandmother    Social History  Substance Use Topics  . Smoking status: Current Every Day Smoker -- 0.00 packs/day    Types: Cigarettes  . Smokeless tobacco: Never Used  . Alcohol Use: No   OB History    Gravida Para Term Preterm AB TAB SAB Ectopic Multiple Living   Review of Systems  Constitutional: Negative for fever and chills.  Respiratory: Negative.  Negative for shortness of breath.   Cardiovascular: Negative.  Negative for chest pain and leg swelling.  Musculoskeletal:       See HPI.  Skin: Negative.  Negative for color change and wound.  Neurological: Negative.  Negative for numbness.      Allergies   Aspirin  Home Medications   Prior to Admission medications   Medication Sig Start Date End Date Taking? Authorizing Provider  ibuprofen (ADVIL,MOTRIN) 800 MG tablet Take 1 tablet (800 mg total) by mouth 3 (three) times daily with meals as needed for headache or moderate pain. 10/18/15   Tereso Newcomer, MD  megestrol (MEGACE) 40 MG tablet Take two tablets twice daily until bleeding stops, then one tablet twice daily. 10/18/15   Tereso Newcomer, MD  triamcinolone cream (KENALOG) 0.1 % Apply 1 application topically 2 (two) times daily. 10/29/15   Hayden Rasmussen, NP   BP 157/87 mmHg  Pulse 81  Temp(Src) 98.6 F (37 C) (Oral)  Resp 16  Ht  (1.626 m)  Wt 97.977 kg  BMI 37.06 kg/m2  SpO2 100%  LMP 10/16/2015 Physical Exam  Constitutional: She is oriented to person, place, and time. She appears well-developed and well-nourished.  Neck: Normal range of motion.  Pulmonary/Chest: Effort normal.  Musculoskeletal:  LE's bilaterally have no swelling, redness, warmth or induration. Calves tender bilaterally. Distal pulses intact. No joint tenderness and no thigh tenderness. FrOM.  Neurological: She is alert and oriented to person, place, and time.  Skin: Skin is warm and dry.    ED Course  Procedures (including critical care time) Labs Review Labs Reviewed  CBC WITH DIFFERENTIAL/PLATELET - Abnormal; Notable for the following:  Hemoglobin 10.8 (*)    HCT 34.1 (*)    RDW 17.2 (*)    Monocytes Absolute 1.1 (*)    All other components within normal limits  BASIC METABOLIC PANEL - Abnormal; Notable for the following:    Glucose, Bld 114 (*)    All other components within normal limits    Imaging Review No results found. I have personally reviewed and evaluated these images and lab results as part of my medical decision-making.   EKG Interpretation None      MDM   Final diagnoses:  None    1. LE muscle cramps  Symptoms of bilateral and intermittent calf pain x 2 days.  Feels cramping in nature, without injury. No evidence infection. Low suspicion for clots given intermittent, bilateral presentation, no swelling or redness. Normal potassium. No concern for dehydration. Will provide muscle relaxer and encourage PCP follow up for recheck in 2-3 days.    Elpidio AnisShari Tayshaun Kroh, PA-C 11/09/15 [id number]  Laurence Spatesachel Morgan Little, MD 11/10/15 [phone]

## 2015-11-09 NOTE — Discharge Instructions (Signed)

## 2015-11-11 ENCOUNTER — Telehealth: Payer: Self-pay | Admitting: Family Medicine

## 2015-11-11 NOTE — Telephone Encounter (Signed)
Bonita QuinLinda, Please see message below and Advised. Looks like she is scheduled for follow up with GYN on 11/24/2015. Please advise on bp. Thanks!

## 2015-11-11 NOTE — Telephone Encounter (Signed)
Patient is scheduled for bp check on 11/19/2015 and for an appointment on 12/03/2015. Thanks!

## 2015-11-11 NOTE — Telephone Encounter (Signed)
Heather the case manager at [employer] [facility] called and wanted to make the patient's PCP aware that she's still having blood pressure issues. She went to the ER on 11/09/15 for muscle cramping. Her BP was 157/87. Pt was encouraged by Herbert SetaHeather to follow up with her PCP. She's also still have AUB, was encouraged to follow up with her gyn, but hasn't yet.

## 2015-11-11 NOTE — Telephone Encounter (Signed)
She just needs to make an appt and come in.

## 2015-11-19 ENCOUNTER — Ambulatory Visit (INDEPENDENT_AMBULATORY_CARE_PROVIDER_SITE_OTHER): Payer: Medicaid Other | Admitting: *Deleted

## 2015-11-19 VITALS — BP 138/70

## 2015-11-19 DIAGNOSIS — I10 Essential (primary) hypertension: Secondary | ICD-10-CM

## 2015-11-19 DIAGNOSIS — R030 Elevated blood-pressure reading, without diagnosis of hypertension: Principal | ICD-10-CM

## 2015-11-19 DIAGNOSIS — IMO0001 Reserved for inherently not codable concepts without codable children: Secondary | ICD-10-CM

## 2015-11-19 NOTE — Progress Notes (Signed)
Patient is here for BP recheck  Patient complains of acute right hand pain being present for the first 5-10 minutes of visit. Pain radiated up to mid forearm. Patient states she has never had the pain before and the pain felt like tingling and went away. Patient has just came from work which she began at 8 am.  Patient has not taken medication today and patient has not eaten.

## 2015-11-19 NOTE — Patient Instructions (Signed)
Patient is aware of BP being normal during visit and a recheck will completed at her 12/03/15 visit.

## 2015-11-24 ENCOUNTER — Ambulatory Visit: Payer: Medicaid Other | Admitting: Obstetrics & Gynecology

## 2015-12-03 ENCOUNTER — Ambulatory Visit: Payer: Medicaid Other | Admitting: Family Medicine

## 2015-12-23 ENCOUNTER — Telehealth: Payer: Self-pay | Admitting: Family Medicine

## 2015-12-23 NOTE — Telephone Encounter (Signed)
Returned patients call regarding AUB and advised her to call her Hilaria OtaBGYN,Ugoona Ayonwu MD regarding Megace rx and her continued vaginal bleeding. Patient states she will call today.

## 2015-12-24 ENCOUNTER — Telehealth: Payer: Self-pay | Admitting: General Practice

## 2015-12-24 NOTE — Telephone Encounter (Signed)
Patient called and left message on nurse line requesting a medication for bleeding as she has been bleeding for 3 weeks straight now. Called patient and she states she took the megace when she started her period 3 three weeks two pills twice a day but the medication wasn't working so she stopped taking it after a week. Patient states she has continued to bleed and feels tired and weak often. Discussed patient's symptoms with Dr Macon LargeAnyanwu who states patient may take megace two pills three a day. Discussed with patient. Patient states something is wrong with her and she has never bleed this much in her life, she thinks the medication caused it. Told patient the medication does not cause bleeding but actually helps stop bleeding. Told patient if she would like to follow up with Dr Macon LargeAnyanwu she certainly can or she may go to [facility] for further evaluation. [facility] Patient verbalized understanding to all & had no questions

## 2016-01-13 ENCOUNTER — Encounter (HOSPITAL_COMMUNITY): Payer: Self-pay

## 2016-01-13 ENCOUNTER — Inpatient Hospital Stay (HOSPITAL_COMMUNITY)
Admission: AD | Admit: 2016-01-13 | Discharge: 2016-01-13 | Disposition: A | Discharge status: Home / Self Care | Payer: Medicaid Other | Source: Ambulatory Visit | Attending: Obstetrics & Gynecology | Admitting: Obstetrics & Gynecology

## 2016-01-13 DIAGNOSIS — F17210 Nicotine dependence, cigarettes, uncomplicated: Secondary | ICD-10-CM | POA: Insufficient documentation

## 2016-01-13 DIAGNOSIS — Z886 Allergy status to analgesic agent status: Secondary | ICD-10-CM | POA: Insufficient documentation

## 2016-01-13 DIAGNOSIS — Z841 Family history of disorders of kidney and ureter: Secondary | ICD-10-CM | POA: Diagnosis not present

## 2016-01-13 DIAGNOSIS — Z8249 Family history of ischemic heart disease and other diseases of the circulatory system: Secondary | ICD-10-CM | POA: Insufficient documentation

## 2016-01-13 DIAGNOSIS — K219 Gastro-esophageal reflux disease without esophagitis: Secondary | ICD-10-CM | POA: Diagnosis not present

## 2016-01-13 DIAGNOSIS — Z3202 Encounter for pregnancy test, result negative: Secondary | ICD-10-CM | POA: Diagnosis not present

## 2016-01-13 DIAGNOSIS — Z833 Family history of diabetes mellitus: Secondary | ICD-10-CM | POA: Insufficient documentation

## 2016-01-13 DIAGNOSIS — R112 Nausea with vomiting, unspecified: Secondary | ICD-10-CM | POA: Insufficient documentation

## 2016-01-13 DIAGNOSIS — I10 Essential (primary) hypertension: Secondary | ICD-10-CM | POA: Diagnosis not present

## 2016-01-13 DIAGNOSIS — R1013 Epigastric pain: Secondary | ICD-10-CM | POA: Diagnosis not present

## 2016-01-13 LAB — COMPREHENSIVE METABOLIC PANEL
ALBUMIN: 3.7 g/dL (ref 3.5–5.0)
ALT: 11 U/L — AB (ref 14–54)
ANION GAP: 7 (ref 5–15)
AST: 16 U/L (ref 15–41)
Alkaline Phosphatase: 51 U/L (ref 38–126)
BUN: 12 mg/dL (ref 6–20)
CHLORIDE: 105 mmol/L (ref 101–111)
CO2: 25 mmol/L (ref 22–32)
Calcium: 8.6 mg/dL — ABNORMAL LOW (ref 8.9–10.3)
Creatinine, Ser: 0.85 mg/dL (ref 0.44–1.00)
GFR calc non Af Amer: >60 mL/min (ref >60)
Glucose, Bld: 109 mg/dL — ABNORMAL HIGH (ref 65–99)
Potassium: 3.4 mmol/L — ABNORMAL LOW (ref 3.5–5.1)
SODIUM: 137 mmol/L (ref 135–145)
Total Bilirubin: 0.5 mg/dL (ref 0.3–1.2)
Total Protein: 7 g/dL (ref 6.5–8.1)

## 2016-01-13 LAB — URINALYSIS, ROUTINE W REFLEX MICROSCOPIC
BILIRUBIN URINE: NEGATIVE
Glucose, UA: NEGATIVE mg/dL
HGB URINE DIPSTICK: NEGATIVE
Ketones, ur: NEGATIVE mg/dL
Leukocytes, UA: NEGATIVE
NITRITE: NEGATIVE
PH: 7 (ref 5.0–8.0)
Protein, ur: NEGATIVE mg/dL
SPECIFIC GRAVITY, URINE: 1.02 (ref 1.005–1.030)

## 2016-01-13 LAB — CBC
HCT: 31.7 % — ABNORMAL LOW (ref 36.0–46.0)
Hemoglobin: 10.1 g/dL — ABNORMAL LOW (ref 12.0–15.0)
MCH: 26.2 pg (ref 26.0–34.0)
MCHC: 31.9 g/dL (ref 30.0–36.0)
MCV: 82.1 fL (ref 78.0–100.0)
PLATELETS: 254 10*3/uL (ref 150–400)
RBC: 3.86 MIL/uL — AB (ref 3.87–5.11)
RDW: 17.1 % — ABNORMAL HIGH (ref 11.5–15.5)
WBC: 10.1 10*3/uL (ref 4.0–10.5)

## 2016-01-13 LAB — POCT PREGNANCY, URINE: PREG TEST UR: NEGATIVE

## 2016-01-13 MED ORDER — PROMETHAZINE HCL 12.5 MG PO TABS: 12.5000 mg | ORAL_TABLET | Freq: Four times a day (QID) | ORAL | 0 refills | Status: DC | PRN

## 2016-01-13 MED ORDER — PANTOPRAZOLE SODIUM 40 MG PO TBEC: 40.0000 mg | DELAYED_RELEASE_TABLET | Freq: Every day | ORAL | 0 refills | Status: DC

## 2016-01-13 MED ORDER — ONDANSETRON 8 MG PO TBDP
8.0000 mg | ORAL_TABLET | Freq: Once | ORAL | Status: AC
  Administered 2016-01-13: 8 mg via ORAL
  Filled 2016-01-13: qty 1

## 2016-01-13 MED ORDER — GI COCKTAIL ~~LOC~~
30.0000 mL | Freq: Once | ORAL | Status: AC
  Administered 2016-01-13: 30 mL via ORAL
  Filled 2016-01-13: qty 30

## 2016-01-13 MED ORDER — ONDANSETRON 4 MG PO TBDP: 4.0000 mg | ORAL_TABLET | Freq: Three times a day (TID) | ORAL | 0 refills | Status: DC | PRN

## 2016-01-13 NOTE — MAU Provider Note (Signed)
History     CSN: [id number]  Arrival date and time: 01/13/16 1803   First Provider Initiated Contact with Patient 01/13/16 1856      Chief Complaint  Patient presents with  . Emesis  . Abdominal Pain   Ashley Morton is a 35 y.o. Female who presents with n/v & epigastric pain. Reports epigastric pain since Sunday that comes & goes & radiate to substernal chest. Describes as sharp & unlike anything she's felt before.    Emesis   This is a new problem. Episode onset: since Sunday. The problem occurs 2 to 4 times per day. The problem has been unchanged. The emesis has an appearance of bile. There has been no fever. Associated symptoms include abdominal pain and chest pain (epigastric vs chest pain). Pertinent negatives include no chills, coughing, diarrhea or dizziness. Treatments tried: nausea med she has at home; doesn't know name of it. The treatment provided no relief.   OB History    Gravida Para Term Preterm AB Living   4       1 3    SAB TAB Ectopic Multiple Live Births       1   3      Past Medical History:  Diagnosis Date  . Diabetes mellitus without complication (HCC)   . Hypertension     Past Surgical History:  Procedure Laterality Date  . SHOULDER SURGERY      Family History  Problem Relation Age of Onset  . Diabetes Maternal Grandmother   . Kidney disease Maternal Grandmother   . Heart disease Maternal Grandmother     Social History  Substance Use Topics  . Smoking status: Current Every Day Smoker    Packs/day: 1.00    Types: Cigarettes  . Smokeless tobacco: Current User  . Alcohol use 0.0 oz/week     Comment: Occasional    Allergies:  Allergies  Allergen Reactions  . Aspirin Other (See Comments)    States she cannot take the taste of the ASA even the chewable    Prescriptions Prior to Admission  Medication Sig Dispense Refill Last Dose  . ibuprofen (ADVIL,MOTRIN) 800 MG tablet Take 1 tablet (800 mg total) by mouth 3 (three) times daily with  meals as needed for headache or moderate pain. 30 tablet 3   . megestrol (MEGACE) 40 MG tablet Take two tablets twice daily until bleeding stops, then one tablet twice daily. 90 tablet 3   . methocarbamol (ROBAXIN) 500 MG tablet Take 1 tablet (500 mg total) by mouth every 8 (eight) hours as needed for muscle spasms. 12 tablet 0   . triamcinolone cream (KENALOG) 0.1 % Apply 1 application topically 2 (two) times daily. 30 g 0     Review of Systems  Constitutional: Negative.  Negative for chills.  Respiratory: Negative for cough.   Cardiovascular: Positive for chest pain (epigastric vs chest pain).  Gastrointestinal: Positive for abdominal pain, nausea and vomiting. Negative for constipation, diarrhea and heartburn.  Genitourinary: Negative.   Neurological: Negative for dizziness.   Physical Exam   Blood pressure 135/74, pulse 94, temperature 99 F (37.2 C), temperature source Oral, resp. rate 18, last menstrual period 12/09/2015.  Physical Exam  Nursing note and vitals reviewed. Constitutional: She is oriented to person, place, and time. She appears well-developed and well-nourished. No distress.  HENT:  Head: Normocephalic and atraumatic.  Eyes: Conjunctivae are normal. Right eye exhibits no discharge. Left eye exhibits no discharge. No scleral icterus.  Neck: Normal range of  motion.  Cardiovascular: Normal rate, regular rhythm and normal heart sounds.   No murmur heard. Respiratory: Effort normal and breath sounds normal. No respiratory distress. She has no wheezes.  GI: Soft. Bowel sounds are normal. She exhibits no distension. There is no tenderness. There is no rebound, no guarding and negative Murphy's sign.  Neurological: She is alert and oriented to person, place, and time.  Skin: Skin is warm and dry. She is not diaphoretic.  Psychiatric: She has a normal mood and affect. Her behavior is normal. Judgment and thought content normal.    MAU Course  Procedures Results for  orders placed or performed during the hospital encounter of 01/13/16 (from the past 24 hour(s))  Urinalysis, Routine w reflex microscopic (not at [facility])     Status: Abnormal   Collection Time: 01/13/16  6:30 PM  Result Value Ref Range   Color, Urine YELLOW YELLOW   APPearance HAZY (A) CLEAR   Specific Gravity, Urine 1.020 1.005 - 1.030   pH 7.0 5.0 - 8.0   Glucose, UA NEGATIVE NEGATIVE mg/dL   Hgb urine dipstick NEGATIVE NEGATIVE   Bilirubin Urine NEGATIVE NEGATIVE   Ketones, ur NEGATIVE NEGATIVE mg/dL   Protein, ur NEGATIVE NEGATIVE mg/dL   Nitrite NEGATIVE NEGATIVE   Leukocytes, UA NEGATIVE NEGATIVE  Pregnancy, urine POC     Status: None   Collection Time: 01/13/16  7:11 PM  Result Value Ref Range   Preg Test, Ur NEGATIVE NEGATIVE  CBC     Status: Abnormal   Collection Time: 01/13/16  8:05 PM  Result Value Ref Range   WBC 10.1 4.0 - 10.5 K/uL   RBC 3.86 (L) 3.87 - 5.11 MIL/uL   Hemoglobin 10.1 (L) 12.0 - 15.0 g/dL   HCT 40.9 (L) 81.1 - 91.4 %   MCV 82.1 78.0 - 100.0 fL   MCH 26.2 26.0 - 34.0 pg   MCHC 31.9 30.0 - 36.0 g/dL   RDW 78.2 (H) 95.6 - 21.3 %   Platelets 254 150 - 400 K/uL  Comprehensive metabolic panel     Status: Abnormal   Collection Time: 01/13/16  8:05 PM  Result Value Ref Range   Sodium 137 135 - 145 mmol/L   Potassium 3.4 (L) 3.5 - 5.1 mmol/L   Chloride 105 101 - 111 mmol/L   CO2 25 22 - 32 mmol/L   Glucose, Bld 109 (H) 65 - 99 mg/dL   BUN 12 6 - 20 mg/dL   Creatinine, Ser 0.86 0.44 - 1.00 mg/dL   Calcium 8.6 (L) 8.9 - 10.3 mg/dL   Total Protein 7.0 6.5 - 8.1 g/dL   Albumin 3.7 3.5 - 5.0 g/dL   AST 16 15 - 41 U/L   ALT 11 (L) 14 - 54 U/L   Alkaline Phosphatase 51 38 - 126 U/L   Total Bilirubin 0.5 0.3 - 1.2 mg/dL   GFR calc non Af Amer >60 >60 mL/min   GFR calc Af Amer >60 >60 mL/min   Anion gap 7 5 - 15    MDM UPT negative CBC, CMP, u/a, EKG GI cocktail Zofran 8 mg ODT EKG & labs normal Pt reports improvement in symptoms GERD likely d/t  high dose NSAID use for AUB  Assessment and Plan  A: 1. Gastroesophageal reflux disease without esophagitis   2. Non-intractable vomiting with nausea, unspecified vomiting type     P: Discharge home D/c ibuprofen & other NSAIDs until symptoms improve Rx phenergan, zofran, & protonix F/u with PCP  if symptoms don't improve  Judeth Hornrin Dietrich Samuelson 01/13/2016, 6:56 PM

## 2016-01-13 NOTE — Discharge Instructions (Signed)

## 2016-07-20 ENCOUNTER — Encounter (HOSPITAL_COMMUNITY): Payer: Self-pay

## 2016-07-20 ENCOUNTER — Emergency Department (HOSPITAL_COMMUNITY): Payer: Medicaid Other

## 2016-07-20 ENCOUNTER — Emergency Department (HOSPITAL_COMMUNITY)
Admission: EM | Admit: 2016-07-20 | Discharge: 2016-07-20 | Disposition: A | Discharge status: Home / Self Care | Payer: Medicaid Other | Attending: Emergency Medicine | Admitting: Emergency Medicine

## 2016-07-20 DIAGNOSIS — E119 Type 2 diabetes mellitus without complications: Secondary | ICD-10-CM | POA: Insufficient documentation

## 2016-07-20 DIAGNOSIS — R0789 Other chest pain: Secondary | ICD-10-CM | POA: Diagnosis not present

## 2016-07-20 DIAGNOSIS — J069 Acute upper respiratory infection, unspecified: Secondary | ICD-10-CM | POA: Insufficient documentation

## 2016-07-20 DIAGNOSIS — F17210 Nicotine dependence, cigarettes, uncomplicated: Secondary | ICD-10-CM | POA: Insufficient documentation

## 2016-07-20 DIAGNOSIS — B9789 Other viral agents as the cause of diseases classified elsewhere: Secondary | ICD-10-CM

## 2016-07-20 DIAGNOSIS — I10 Essential (primary) hypertension: Secondary | ICD-10-CM | POA: Insufficient documentation

## 2016-07-20 DIAGNOSIS — R05 Cough: Secondary | ICD-10-CM | POA: Diagnosis present

## 2016-07-20 LAB — CBC
HEMATOCRIT: 33.5 % — AB (ref 36.0–46.0)
Hemoglobin: 10.7 g/dL — ABNORMAL LOW (ref 12.0–15.0)
MCH: 25.4 pg — ABNORMAL LOW (ref 26.0–34.0)
MCHC: 31.9 g/dL (ref 30.0–36.0)
MCV: 79.4 fL (ref 78.0–100.0)
PLATELETS: 257 10*3/uL (ref 150–400)
RBC: 4.22 MIL/uL (ref 3.87–5.11)
RDW: 18 % — AB (ref 11.5–15.5)
WBC: 8.3 10*3/uL (ref 4.0–10.5)

## 2016-07-20 LAB — COMPREHENSIVE METABOLIC PANEL
ALT: 12 U/L — AB (ref 14–54)
AST: 22 U/L (ref 15–41)
Albumin: 3.9 g/dL (ref 3.5–5.0)
Alkaline Phosphatase: 71 U/L (ref 38–126)
Anion gap: 9 (ref 5–15)
BILIRUBIN TOTAL: 0.6 mg/dL (ref 0.3–1.2)
BUN: 8 mg/dL (ref 6–20)
CHLORIDE: 106 mmol/L (ref 101–111)
CO2: 22 mmol/L (ref 22–32)
CREATININE: 0.75 mg/dL (ref 0.44–1.00)
Calcium: 9.1 mg/dL (ref 8.9–10.3)
GFR calc Af Amer: >60 mL/min (ref >60)
GFR calc non Af Amer: >60 mL/min (ref >60)
Glucose, Bld: 87 mg/dL (ref 65–99)
POTASSIUM: 3.6 mmol/L (ref 3.5–5.1)
Sodium: 137 mmol/L (ref 135–145)
Total Protein: 7.6 g/dL (ref 6.5–8.1)

## 2016-07-20 LAB — URINALYSIS, ROUTINE W REFLEX MICROSCOPIC
BILIRUBIN URINE: NEGATIVE
Glucose, UA: NEGATIVE mg/dL
HGB URINE DIPSTICK: NEGATIVE
Ketones, ur: NEGATIVE mg/dL
Leukocytes, UA: NEGATIVE
NITRITE: NEGATIVE
PH: 7 (ref 5.0–8.0)
Protein, ur: NEGATIVE mg/dL
SPECIFIC GRAVITY, URINE: 1.003 — AB (ref 1.005–1.030)

## 2016-07-20 LAB — I-STAT TROPONIN, ED: Troponin i, poc: 0 ng/mL (ref 0.00–0.08)

## 2016-07-20 LAB — LIPASE, BLOOD: LIPASE: 26 U/L (ref 11–51)

## 2016-07-20 LAB — I-STAT BETA HCG BLOOD, ED (MC, WL, AP ONLY): I-stat hCG, quantitative: <5 m[IU]/mL (ref <5)

## 2016-07-20 MED ORDER — ONDANSETRON 4 MG PO TBDP
4.0000 mg | ORAL_TABLET | Freq: Once | ORAL | Status: AC
  Administered 2016-07-20: 4 mg via ORAL
  Filled 2016-07-20: qty 1

## 2016-07-20 MED ORDER — HYDROCOD POLST-CPM POLST ER 10-8 MG/5ML PO SUER: 5.0000 mL | Freq: Two times a day (BID) | ORAL | 0 refills | Status: DC | PRN

## 2016-07-20 MED ORDER — KETOROLAC TROMETHAMINE 60 MG/2ML IM SOLN
30.0000 mg | Freq: Once | INTRAMUSCULAR | Status: AC
  Administered 2016-07-20: 30 mg via INTRAMUSCULAR
  Filled 2016-07-20: qty 2

## 2016-07-20 MED ORDER — ONDANSETRON HCL 4 MG PO TABS: 4.0000 mg | ORAL_TABLET | Freq: Three times a day (TID) | ORAL | 0 refills | Status: DC | PRN

## 2016-07-20 NOTE — ED Provider Notes (Addendum)
[facility]-EMERGENCY DEPT Provider Note   CSN: [id number] Arrival date & time: 07/20/16  1922     History   Chief Complaint Chief Complaint  Patient presents with  . Chest Pain  . Cough    HPI Ashley AbrahamShanetta Morton is a 36 y.o. female.   Chest Pain   Associated symptoms include back pain, cough and vomiting. Pertinent negatives include no abdominal pain and no fever.  Cough  Associated symptoms include chest pain and sore throat.   36 year old female who presents with one day of cough. She has a history of diabetes and hypertension. States that she developed nonproductive cough, sore throat and congestion one day ago. Has had chest wall pain that is worsening with coughing. Associated with vomiting and nausea as well during that coughing fits. Feeling short of breath. No known fevers or sick contacts. No diarrhea or significant abdominal pain. Has had urinary frequency but no dysuria or hematuria. Has tried taking Tylenol PM without improvement in her symptoms. Past Medical History:  Diagnosis Date  . Diabetes mellitus without complication (HCC)   . Hypertension     Patient Active Problem List   Diagnosis Date Noted  . History of high blood pressure 09/03/2015  . Prediabetes 09/03/2015  . Menorrhagia with irregular cycle 09/03/2015  . Tobacco dependence 09/03/2015  . Obesity 11/06/2014  . Abnormal uterine bleeding (AUB) 11/06/2014    Past Surgical History:  Procedure Laterality Date  . SHOULDER SURGERY      OB History    Gravida Para Term Preterm AB Living   4       1 3    SAB TAB Ectopic Multiple Live Births       1   3       Home Medications    Prior to Admission medications   Medication Sig Start Date End Date Taking? Authorizing Provider  acetaminophen (TYLENOL) 500 MG tablet Take 1,000 mg by mouth every 6 (six) hours as needed for moderate pain.   Yes Historical Provider, MD  chlorpheniramine-HYDROcodone (TUSSIONEX PENNKINETIC ER) 10-8 MG/5ML SUER Take 5 mLs by  mouth every 12 (twelve) hours as needed for cough. 07/20/16   Lavera Guiseana Duo Topaz Raglin, MD  megestrol (MEGACE) 40 MG tablet Take two tablets twice daily until bleeding stops, then one tablet twice daily. Patient taking differently: Take 40 mg by mouth daily.  10/18/15   Tereso NewcomerUgonna A Anyanwu, MD  ondansetron (ZOFRAN ODT) 4 MG disintegrating tablet Take 1 tablet (4 mg total) by mouth every 8 (eight) hours as needed for nausea or vomiting. Patient not taking: Reported on 07/20/2016 01/13/16   Judeth HornErin Lawrence, NP  ondansetron (ZOFRAN) 4 MG tablet Take 1 tablet (4 mg total) by mouth every 8 (eight) hours as needed for nausea or vomiting. 07/20/16   Lavera Guiseana Duo Dia Jefferys, MD  pantoprazole (PROTONIX) 40 MG tablet Take 1 tablet (40 mg total) by mouth daily. Patient not taking: Reported on 07/20/2016 01/13/16   Judeth HornErin Lawrence, NP  promethazine (PHENERGAN) 12.5 MG tablet Take 1 tablet (12.5 mg total) by mouth every 6 (six) hours as needed for nausea or vomiting. Patient not taking: Reported on 07/20/2016 01/13/16   Judeth HornErin Lawrence, NP    Family History Family History  Problem Relation Age of Onset  . Diabetes Maternal Grandmother   . Kidney disease Maternal Grandmother   . Heart disease Maternal Grandmother     Social History Social History  Substance Use Topics  . Smoking status: Current Every Day Smoker    Packs/day: 1.00  Types: Cigarettes  . Smokeless tobacco: Current User  . Alcohol use 0.0 oz/week     Comment: Occasional     Allergies   Aspirin   Review of Systems Review of Systems  Constitutional: Positive for fatigue. Negative for fever.  HENT: Positive for congestion and sore throat.   Respiratory: Positive for cough.   Cardiovascular: Positive for chest pain.  Gastrointestinal: Positive for vomiting. Negative for abdominal pain.  Genitourinary: Positive for frequency.  Musculoskeletal: Positive for back pain.  Allergic/Immunologic: Negative for immunocompromised state.  Neurological: Negative for syncope.    Hematological: Does not bruise/bleed easily.  Psychiatric/Behavioral: Negative for confusion.  All other systems reviewed and are negative.    Physical Exam Updated Vital Signs BP (!) 162/106 (BP Location: Left Arm)   Pulse 99   Temp 98.8 F (37.1 C) (Oral)   Resp (!) 22   Ht 5\' 3"  (1.6 m)   Wt 216 lb (98 kg)   LMP 07/04/2016   SpO2 100%   BMI 38.26 kg/m   Physical Exam Physical Exam  Constitutional: She appears well-developed and well-nourished. No acute distress HENT:  Head: Normocephalic.  Eyes: Conjunctivae are normal.  Cardiovascular: Normal rate and intact distal pulses.   Pulmonary/Chest: Effort normal. No respiratory distress.  Abdominal: Shee exhibits no distension. No tenderness. Musculoskeletal: Normal range of motion.  Neurological: She is alert.  Skin: Skin is warm and dry.  Psychiatric: She has a normal mood and affect. Behavior is normal.  Nursing note and vitals reviewed.   ED Treatments / Results  Labs (all labs ordered are listed, but only abnormal results are displayed) Labs Reviewed  CBC - Abnormal; Notable for the following:       Result Value   Hemoglobin 10.7 (*)    HCT 33.5 (*)    MCH 25.4 (*)    RDW 18.0 (*)    All other components within normal limits  COMPREHENSIVE METABOLIC PANEL - Abnormal; Notable for the following:    ALT 12 (*)    All other components within normal limits  URINALYSIS, ROUTINE W REFLEX MICROSCOPIC - Abnormal; Notable for the following:    Color, Urine STRAW (*)    Specific Gravity, Urine 1.003 (*)    All other components within normal limits  LIPASE, BLOOD  I-STAT TROPOININ, ED  I-STAT BETA HCG BLOOD, ED ([facility], [facility], [facility] ONLY)    EKG  EKG Interpretation  Date/Time:  Thursday July 20 2016 19:34:54 EDT Ventricular Rate:  100 PR Interval:    QRS Duration: 82 QT Interval:  326 QTC Calculation: 421 R Axis:   95 Text Interpretation:  Sinus tachycardia Borderline right axis deviation no acute changes   Confirmed by Quention Mcneill MD, Acelyn Basham [phone]) on 07/20/2016 9:37:17 PM       Radiology Dg Chest 2 View  Result Date: 07/20/2016 CLINICAL DATA:  Acute onset of central chest pain and cough. Initial encounter. EXAM: CHEST  2 VIEW COMPARISON:  Chest radiograph performed 01/19/2015 FINDINGS: The lungs are well-aerated and clear. There is no evidence of focal opacification, pleural effusion or pneumothorax. The heart is normal in size; the mediastinal contour is within normal limits. No acute osseous abnormalities are seen. IMPRESSION: No acute cardiopulmonary process seen. Electronically Signed   By: Roanna Raider M.D.   On: 07/20/2016 20:37    Procedures Procedures (including critical care time)  Medications Ordered in ED Medications  ondansetron (ZOFRAN-ODT) disintegrating tablet 4 mg (4 mg Oral Given 07/20/16 2203)  ketorolac (TORADOL) injection 30  mg (30 mg Intramuscular Given 07/20/16 2202)     Initial Impression / Assessment and Plan / ED Course  I have reviewed the triage vital signs and the nursing notes.  Pertinent labs & imaging results that were available during my care of the patient were reviewed by me and considered in my medical decision making (see chart for details).     Presenting with symptoms that seem consistent with likely viral respiratory illness. Afebrile, and hemodynamically stable. Breathing comfortably on room air with normal oxygenation. Chest x-ray visualized and does not show acute cardiopulmonary processes such as edema or pneumonia. Blood work obtained in triage is reassuring. Urinalysis does not suggest infection. She has a nonischemic EKG and troponin is normal. The description of her chest pain seems consistent with that of chest wall pain. Supportive care management for home discussed.   The patient appears reasonably screened and/or stabilized for discharge and I doubt any other medical condition or other [facility] requiring further screening, evaluation, or treatment in  the ED at this time prior to discharge.  Strict return and follow-up instructions reviewed. She expressed understanding of all discharge instructions and felt comfortable with the plan of care.   Final Clinical Impressions(s) / ED Diagnoses   Final diagnoses:  Viral URI with cough  Chest wall pain    New Prescriptions New Prescriptions   CHLORPHENIRAMINE-HYDROCODONE (TUSSIONEX PENNKINETIC ER) 10-8 MG/5ML SUER    Take 5 mLs by mouth every 12 (twelve) hours as needed for cough.   ONDANSETRON (ZOFRAN) 4 MG TABLET    Take 1 tablet (4 mg total) by mouth every 8 (eight) hours as needed for nausea or vomiting.     Lavera Guise, MD 07/20/16 8119    Lavera Guise, MD 08/01/16 [phone]

## 2016-07-20 NOTE — ED Notes (Signed)
Pt. On cardiac monitor. 

## 2016-07-20 NOTE — Discharge Instructions (Signed)
Your chest x-ray does not show a pneumonia. This is likely a viral infection. Please rest at home and drink plenty of fluids. This may take 1-2 weeks to fully resolve the most people feel that her worst in the first 3-4 days. He can take Tylenol and ibuprofen for pain control. Your prescribed cough medication as well as nausea medication.  Return without fail for worsening symptoms, including confusion, difficulty breathing, passing out, intractable vomiting, or any other symptoms concerning to you

## 2016-07-20 NOTE — ED Triage Notes (Signed)
PT C/O NON-PRODUCTIVE COUGH WITH CHEST PAIN, N/V SOB FOR 2 DAYS. PT DENIES FEVER.

## 2016-08-03 ENCOUNTER — Encounter: Payer: Self-pay | Admitting: Family Medicine

## 2016-08-03 ENCOUNTER — Ambulatory Visit (INDEPENDENT_AMBULATORY_CARE_PROVIDER_SITE_OTHER): Payer: Medicaid Other | Admitting: Family Medicine

## 2016-08-03 VITALS — BP 142/84 | HR 90 | Temp 98.4°F | Resp 14 | Ht 64.0 in | Wt 218.0 lb

## 2016-08-03 DIAGNOSIS — Z23 Encounter for immunization: Secondary | ICD-10-CM

## 2016-08-03 DIAGNOSIS — Z202 Contact with and (suspected) exposure to infections with a predominantly sexual mode of transmission: Secondary | ICD-10-CM | POA: Diagnosis not present

## 2016-08-03 DIAGNOSIS — R7303 Prediabetes: Secondary | ICD-10-CM | POA: Diagnosis not present

## 2016-08-03 DIAGNOSIS — I10 Essential (primary) hypertension: Secondary | ICD-10-CM | POA: Diagnosis not present

## 2016-08-03 LAB — COMPLETE METABOLIC PANEL WITH GFR
ALBUMIN: 3.7 g/dL (ref 3.6–5.1)
ALT: 7 U/L (ref 6–29)
AST: 15 U/L (ref 10–30)
Alkaline Phosphatase: 62 U/L (ref 33–115)
BUN: 11 mg/dL (ref 7–25)
CO2: 21 mmol/L (ref 20–31)
Calcium: 8.8 mg/dL (ref 8.6–10.2)
Chloride: 107 mmol/L (ref 98–110)
Creat: 0.77 mg/dL (ref 0.50–1.10)
GFR, Est African American: >89 mL/min (ref >=60)
Glucose, Bld: 89 mg/dL (ref 65–99)
Potassium: 4 mmol/L (ref 3.5–5.3)
SODIUM: 138 mmol/L (ref 135–146)
TOTAL PROTEIN: 6.9 g/dL (ref 6.1–8.1)
Total Bilirubin: 0.4 mg/dL (ref 0.2–1.2)

## 2016-08-03 LAB — POCT URINALYSIS DIP (DEVICE)
BILIRUBIN URINE: NEGATIVE
Glucose, UA: NEGATIVE mg/dL
Hgb urine dipstick: NEGATIVE
Ketones, ur: NEGATIVE mg/dL
NITRITE: NEGATIVE
PH: 5.5 (ref 5.0–8.0)
PROTEIN: NEGATIVE mg/dL
Specific Gravity, Urine: >=1.03 (ref 1.005–1.030)
UROBILINOGEN UA: 0.2 mg/dL (ref 0.0–1.0)

## 2016-08-03 LAB — CBC WITH DIFFERENTIAL/PLATELET
BASOS ABS: 81 {cells}/uL (ref 0–200)
Basophils Relative: 1 %
EOS ABS: 81 {cells}/uL (ref 15–500)
EOS PCT: 1 %
HCT: 35.3 % (ref 35.0–45.0)
HEMOGLOBIN: 11.2 g/dL — AB (ref 11.7–15.5)
LYMPHS ABS: 1620 {cells}/uL (ref 850–3900)
Lymphocytes Relative: 20 %
MCH: 25.5 pg — AB (ref 27.0–33.0)
MCHC: 31.7 g/dL — ABNORMAL LOW (ref 32.0–36.0)
MCV: 80.4 fL (ref 80.0–100.0)
MPV: 10.4 fL (ref 7.5–12.5)
Monocytes Absolute: 729 cells/uL (ref 200–950)
Monocytes Relative: 9 %
NEUTROS ABS: 5589 {cells}/uL (ref 1500–7800)
Neutrophils Relative %: 69 %
Platelets: 321 10*3/uL (ref 140–400)
RBC: 4.39 MIL/uL (ref 3.80–5.10)
RDW: 19.2 % — ABNORMAL HIGH (ref 11.0–15.0)
WBC: 8.1 10*3/uL (ref 3.8–10.8)

## 2016-08-03 MED ORDER — HYDROCHLOROTHIAZIDE 12.5 MG PO TABS: 12.5000 mg | ORAL_TABLET | Freq: Every day | ORAL | 3 refills | Status: DC

## 2016-08-03 NOTE — Patient Instructions (Signed)
Hypertension: Take Hydrochlorothiazide 12.5 mg daily.  Follow DASH Diet  Will follow up by phone with laboratory results  Received TDap vaccination

## 2016-08-03 NOTE — Progress Notes (Signed)
Subjective:    Patient ID: Ashley Morton, female    DOB: Aug 09, 1980, 36 y.o.   MRN: [id number]  HPI Mr. Denyla Cortese, a 36 year old female with a history of hypertension and prediabetes presents for a follow up of chronic conditions. She was recently evaluated in the emergency department for an upper respiratory infection. She says that congestive cough continues. Cough occurs throughout the day. She says that she completed antibiotics and chest xray was negative. She continues to smoke around 5-10 cigarettes daily. She denies headache, fever, dyspnea, chest pain, nausea, vomiting, or diarrhea.   Ms. Leslee Home also has a history of hypertension. She has been off of anti hypertension medications over the past year. She does not exercise or follow a low fat, low sodium diet.   She is also requesting STD testing. She recently changed sexual partners and has not been using barrier protection with sexual intercourse. She denies vaginal discharge, itching, genital lesions, vomiting, fatigue, or skin changes.    Past Medical History:  Diagnosis Date  . Diabetes mellitus without complication (HCC)   . Hypertension    Social History   Social History  . Marital status: Single    Spouse name: N/A  . Number of children: N/A  . Years of education: N/A   Occupational History  . Not on file.   Social History Main Topics  . Smoking status: Current Every Day Smoker    Packs/day: 1.00    Types: Cigarettes  . Smokeless tobacco: Current User  . Alcohol use 0.0 oz/week     Comment: Occasional  . Drug use: No  . Sexual activity: Yes    Partners: Male   Other Topics Concern  . Not on file   Social History Narrative  . No narrative on file    Immunization History  Administered Date(s) Administered  . Pneumococcal Polysaccharide-23 09/03/2015  . Tdap 08/03/2016   Allergies  Allergen Reactions  . Aspirin Other (See Comments)    States she cannot take the taste of the ASA even the  chewable   Review of Systems  Constitutional: Negative.  Negative for fatigue.  HENT: Negative.   Eyes: Negative.   Respiratory: Positive for cough.   Cardiovascular: Negative.   Gastrointestinal: Negative.   Endocrine: Negative for polydipsia, polyphagia and polyuria.  Genitourinary: Negative.   Musculoskeletal: Negative.   Skin: Negative.   Allergic/Immunologic: Negative.   Neurological: Negative.  Negative for dizziness.  Hematological: Negative.   Psychiatric/Behavioral: Negative.        Objective:   Physical Exam  Constitutional: She is oriented to person, place, and time. She appears well-developed and well-nourished.  HENT:  Head: Normocephalic and atraumatic.  Right Ear: External ear normal.  Left Ear: External ear normal.  Nose: Nose normal.  Eyes: Conjunctivae and EOM are normal. Pupils are equal, round, and reactive to light.  Neck: Normal range of motion. Neck supple.  Cardiovascular: Normal rate, regular rhythm, normal heart sounds and intact distal pulses.   Pulmonary/Chest: Effort normal and breath sounds normal.  Abdominal: Soft. Bowel sounds are normal.  Musculoskeletal: Normal range of motion.  Neurological: She is alert and oriented to person, place, and time. She has normal reflexes.  Skin: Skin is warm and dry.  Psychiatric: She has a normal mood and affect. Her behavior is normal. Judgment and thought content normal.        BP (!) 153/80 (BP Location: Left Arm, Patient Position: Sitting, Cuff Size: Large)   Pulse 90  Temp 98.4 F (36.9 C) (Oral)   Resp 14   Ht  (1.626 m)   Wt 218 lb (98.9 kg)   LMP 07/04/2016   SpO2 100%   BMI 37.42 kg/m  Assessment & Plan:  1. Essential hypertension Will restart antihypertensive medications. Recommend a low sodium diet and exercise regimen. Recommend a lowfat, low carbohydrate diet divided over 5-6 small meals, increase water intake to 6-8 glasses, and 150 minutes per week of cardiovascular exercise.    Will return in 1 week for a blood pressure check and 1 month for hypertension - hydrochlorothiazide (HYDRODIURIL) 12.5 MG tablet; Take 1 tablet (12.5 mg total) by mouth daily.  Dispense: 90 tablet; Refill: 3 - CBC with Differential - COMPLETE METABOLIC PANEL WITH GFR  2. Possible exposure to STD Recommend barrier protection with sexual intercourse.  - HIV antibody (with reflex) - GC/Chlamydia Probe Amp - RPR  3. Need for Tdap vaccination - Tdap vaccine greater than or equal to 7yo IM  4. Prediabetes The patient is asked to make an attempt to improve diet and exercise patterns to aid in medical management of this problem.  - HgB A1c   RTC: 1 week for BP check with RN and 1 month for hypertension f/u  Nolon Nations  MSN, FNP-C [facility] [address] [place], [place] [address] [phone]

## 2016-08-04 LAB — GC/CHLAMYDIA PROBE AMP
CT Probe RNA: NOT DETECTED
GC PROBE AMP APTIMA: NOT DETECTED

## 2016-08-04 LAB — RPR

## 2016-08-04 LAB — HIV ANTIBODY (ROUTINE TESTING W REFLEX): HIV 1&2 Ab, 4th Generation: NONREACTIVE

## 2016-08-08 ENCOUNTER — Telehealth: Payer: Self-pay

## 2016-08-08 NOTE — Telephone Encounter (Signed)
-----   Message from Massie Maroon, [place] sent at 08/07/2016  7:46 PM EDT ----- Regarding: lab results Please inform Ashley Morton that all of her labs were within a normal range.   Thanks.  ----- Message ----- From: Interface, Lab In Three Zero Five Sent: 08/04/2016   5:33 AM To: Massie Maroon, FNP

## 2016-08-08 NOTE — Telephone Encounter (Signed)
Called, no answer and voicemail was full. Will try later. Thanks!  

## 2016-08-08 NOTE — Telephone Encounter (Signed)
Called, no answer. No way to leave a message.

## 2016-08-08 NOTE — Telephone Encounter (Signed)
Called and advised patient of all normal labs. Thanks!

## 2016-09-28 ENCOUNTER — Ambulatory Visit: Payer: Medicaid Other | Admitting: Family Medicine

## 2016-10-16 ENCOUNTER — Other Ambulatory Visit: Payer: Self-pay | Admitting: Family Medicine

## 2016-10-16 ENCOUNTER — Encounter: Payer: Self-pay | Admitting: Family Medicine

## 2016-10-16 ENCOUNTER — Ambulatory Visit (HOSPITAL_COMMUNITY)
Admission: EM | Admit: 2016-10-16 | Discharge: 2016-10-16 | Disposition: A | Discharge status: Home / Self Care | Payer: Medicaid Other | Attending: Family Medicine | Admitting: Family Medicine

## 2016-10-16 ENCOUNTER — Encounter (HOSPITAL_COMMUNITY): Payer: Self-pay | Admitting: Emergency Medicine

## 2016-10-16 ENCOUNTER — Ambulatory Visit (HOSPITAL_COMMUNITY)
Admission: RE | Admit: 2016-10-16 | Discharge: 2016-10-16 | Disposition: A | Discharge status: Home / Self Care | Payer: Medicaid Other | Source: Ambulatory Visit | Attending: Family Medicine | Admitting: Family Medicine

## 2016-10-16 ENCOUNTER — Ambulatory Visit (INDEPENDENT_AMBULATORY_CARE_PROVIDER_SITE_OTHER): Payer: Medicaid Other | Admitting: Family Medicine

## 2016-10-16 VITALS — BP 146/78 | HR 84 | Temp 98.6°F | Resp 16 | Ht 64.0 in | Wt 219.0 lb

## 2016-10-16 DIAGNOSIS — M25562 Pain in left knee: Secondary | ICD-10-CM | POA: Diagnosis not present

## 2016-10-16 DIAGNOSIS — R937 Abnormal findings on diagnostic imaging of other parts of musculoskeletal system: Secondary | ICD-10-CM | POA: Diagnosis not present

## 2016-10-16 DIAGNOSIS — S8992XD Unspecified injury of left lower leg, subsequent encounter: Secondary | ICD-10-CM

## 2016-10-16 DIAGNOSIS — Z87898 Personal history of other specified conditions: Secondary | ICD-10-CM | POA: Diagnosis not present

## 2016-10-16 DIAGNOSIS — S86912A Strain of unspecified muscle(s) and tendon(s) at lower leg level, left leg, initial encounter: Secondary | ICD-10-CM

## 2016-10-16 DIAGNOSIS — R55 Syncope and collapse: Secondary | ICD-10-CM | POA: Diagnosis not present

## 2016-10-16 DIAGNOSIS — D638 Anemia in other chronic diseases classified elsewhere: Secondary | ICD-10-CM

## 2016-10-16 DIAGNOSIS — T148XXA Other injury of unspecified body region, initial encounter: Secondary | ICD-10-CM

## 2016-10-16 LAB — COMPLETE METABOLIC PANEL WITH GFR
ALT: 9 U/L (ref 6–29)
AST: 18 U/L (ref 10–30)
Albumin: 3.9 g/dL (ref 3.6–5.1)
Alkaline Phosphatase: 63 U/L (ref 33–115)
BILIRUBIN TOTAL: 0.4 mg/dL (ref 0.2–1.2)
BUN: 14 mg/dL (ref 7–25)
CO2: 21 mmol/L (ref 20–31)
CREATININE: 0.94 mg/dL (ref 0.50–1.10)
Calcium: 9.1 mg/dL (ref 8.6–10.2)
Chloride: 109 mmol/L (ref 98–110)
GFR, Est Non African American: 78 mL/min (ref >=60)
GLUCOSE: 94 mg/dL (ref 65–99)
Potassium: 4.1 mmol/L (ref 3.5–5.3)
Sodium: 138 mmol/L (ref 135–146)
TOTAL PROTEIN: 6.8 g/dL (ref 6.1–8.1)

## 2016-10-16 LAB — CBC WITH DIFFERENTIAL/PLATELET
BASOS PCT: 0 %
Basophils Absolute: 0 cells/uL (ref 0–200)
EOS ABS: 158 {cells}/uL (ref 15–500)
Eosinophils Relative: 2 %
HCT: 32.5 % — ABNORMAL LOW (ref 35.0–45.0)
HEMOGLOBIN: 10.5 g/dL — AB (ref 11.7–15.5)
LYMPHS ABS: 2291 {cells}/uL (ref 850–3900)
Lymphocytes Relative: 29 %
MCH: 26.4 pg — ABNORMAL LOW (ref 27.0–33.0)
MCHC: 32.3 g/dL (ref 32.0–36.0)
MCV: 81.7 fL (ref 80.0–100.0)
MONO ABS: 553 {cells}/uL (ref 200–950)
MONOS PCT: 7 %
MPV: 10.4 fL (ref 7.5–12.5)
NEUTROS ABS: 4898 {cells}/uL (ref 1500–7800)
Neutrophils Relative %: 62 %
PLATELETS: 295 10*3/uL (ref 140–400)
RBC: 3.98 MIL/uL (ref 3.80–5.10)
RDW: 17.9 % — ABNORMAL HIGH (ref 11.0–15.0)
WBC: 7.9 10*3/uL (ref 3.8–10.8)

## 2016-10-16 LAB — POCT GLYCOSYLATED HEMOGLOBIN (HGB A1C): Hemoglobin A1C: 5.2

## 2016-10-16 MED ORDER — TRAMADOL HCL 50 MG PO TABS: 50.0000 mg | ORAL_TABLET | Freq: Four times a day (QID) | ORAL | 0 refills | Status: DC | PRN

## 2016-10-16 MED ORDER — KETOROLAC TROMETHAMINE 60 MG/2ML IM SOLN
60.0000 mg | Freq: Once | INTRAMUSCULAR | Status: AC
  Administered 2016-10-16: 60 mg via INTRAMUSCULAR

## 2016-10-16 MED ORDER — IBUPROFEN 600 MG PO TABS: 600.0000 mg | ORAL_TABLET | Freq: Three times a day (TID) | ORAL | 0 refills | Status: DC | PRN

## 2016-10-16 NOTE — Discharge Instructions (Signed)
Please call your doctor to evaluate why you passed out.  Please call Dr. Teryl LucyJoshua Landau to evaluate the knee with further imaging studies.

## 2016-10-16 NOTE — ED Provider Notes (Signed)
[facility]    CSN: [id number] Arrival date & time: 10/16/16  1234     History   Chief Complaint Chief Complaint  Patient presents with  . Fall    HPI Ashley Morton is a 36 y.o. female.   [facility] with a complaint left knee pain secondary to a fall that occurred 2 weeks ago.  She was at her daughter's graduation when she passed out and somehow injured her left knee. She says that she has chronic anemia and is seen in the sickle cell clinic.  Patient has had knee pain ever since. 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 She also takes care of her family. She says that she's had some swelling in the knee and that it hurts on the inside of the joint line. She's had no locking of the knee joint.      Past Medical History:  Diagnosis Date  . Diabetes mellitus without complication (HCC)   . Hypertension     Patient Active Problem List   Diagnosis Date Noted  . Essential hypertension 08/03/2016  . History of high blood pressure 09/03/2015  . Prediabetes 09/03/2015  . Menorrhagia with irregular cycle 09/03/2015  . Tobacco dependence 09/03/2015  . Obesity 11/06/2014  . Abnormal uterine bleeding (AUB) 11/06/2014    Past Surgical History:  Procedure Laterality Date  . SHOULDER SURGERY      OB History    Gravida Para Term Preterm AB Living   4       1 3    SAB TAB Ectopic Multiple Live Births       1   3       Home Medications    Prior to Admission medications   Medication Sig Start Date End Date Taking? Authorizing Provider  hydrochlorothiazide (HYDRODIURIL) 12.5 MG tablet Take 1 tablet (12.5 mg total) by mouth daily. 08/03/16  Yes Massie MaroonHollis, Lachina M, FNP    Family History Family History  Problem Relation Age of Onset  . Diabetes Maternal Grandmother   . Kidney disease Maternal Grandmother   . Heart disease Maternal Grandmother     Social History Social History  Substance Use Topics  .  Smoking status: Current Every Day Smoker    Packs/day: 1.00    Types: Cigarettes  . Smokeless tobacco: Current User  . Alcohol use 0.0 oz/week     Comment: Occasional     Allergies   Aspirin   Review of Systems Review of Systems  Musculoskeletal: Positive for gait problem.  All other systems reviewed and are negative.    Physical Exam Triage Vital Signs ED Triage Vitals  Enc Vitals Group     BP 10/16/16 1251 (!) 141/62     Pulse Rate 10/16/16 1251 78     Resp 10/16/16 1251 18     Temp 10/16/16 1251 98.7 F (37.1 C)     Temp Source 10/16/16 1251 Oral     SpO2 10/16/16 1251 100 %     Weight --      Height --      Head Circumference --      Peak Flow --      Pain Score 10/16/16 1250 8     Pain Loc --      Pain Edu? --      Excl. in GC? --    No data found.   Updated Vital Signs BP (!) 141/62 (BP Location: Right Arm)  Pulse 78   Temp 98.7 F (37.1 C) (Oral)   Resp 18   SpO2 100%    Physical Exam  Constitutional: She is oriented to person, place, and time. She appears well-developed and well-nourished.  HENT:  Right Ear: External ear normal.  Left Ear: External ear normal.  Mouth/Throat: Oropharynx is clear and moist.  Eyes: Conjunctivae are normal. Pupils are equal, round, and reactive to light.  Neck: Normal range of motion. Neck supple.  Pulmonary/Chest: Effort normal.  Musculoskeletal: Normal range of motion. She exhibits no deformity.  Tender medial collateral ligament left knee  Neurological: She is alert and oriented to person, place, and time.  Skin: Skin is warm and dry.  Psychiatric: She has a normal mood and affect. Her behavior is normal.  Nursing note and vitals reviewed.    UC Treatments / Results  Labs (all labs ordered are listed, but only abnormal results are displayed) Labs Reviewed - No data to display  EKG  EKG Interpretation None       Radiology No results found.  Procedures Procedures (including critical care  time)  Medications Ordered in UC Medications - No data to display   Initial Impression / Assessment and Plan / UC Course  I have reviewed the triage vital signs and the nursing notes.  Pertinent labs & imaging results that were available during my care of the patient were reviewed by me and considered in my medical decision making (see chart for details).     Final Clinical Impressions(s) / UC Diagnoses   Final diagnoses:  Knee strain, left, initial encounter  Syncope, unspecified syncope type  Anemia in other chronic diseases classified elsewhere    New Prescriptions New Prescriptions   No medications on file  Refer to orthopedics and her primary care doctor to workup the knee issue as well as the syncope.   Elvina Sidle, MD 10/16/16 1317

## 2016-10-16 NOTE — Patient Instructions (Addendum)
Apply cool compresses to left knee 20 minutes 4 times per day. Use interchangeably with warm moist compresses  Patient received Toradol 60 mg IM for acute left knee pain.   Ibuprofen 600 mg every 8 hours as needed for mild to moderate left knee pain with food.   Tramadol 50 mg every 6 hours as needed for moderate to severe pain   Syncopal episode:   Will follow up by phone with any abnormal laboratory results.   Will schedule follow up after reviewing left knee xray    Musculoskeletal Pain Musculoskeletal pain is muscle and bone aches and pains. This pain can occur in any part of the body. Follow these instructions at home:  Only take medicines for pain, discomfort, or fever as told by your health care provider.  You may continue all activities unless the activities cause more pain. When the pain lessens, slowly resume normal activities. Gradually increase the intensity and duration of the activities or exercise.  During periods of severe pain, bed rest may be helpful. Lie or sit in any position that is comfortable, but get out of bed and walk around at least every several hours.  If directed, put ice on the injured area. ? Put ice in a plastic bag. ? Place a towel between your skin and the bag. ? Leave the ice on for 20 minutes, 2-3 times a day. Contact a health care provider if:  Your pain is getting worse.  Your pain is not relieved with medicines.  You lose function in the area of the pain if the pain is in your arms, legs, or neck. This information is not intended to replace advice given to you by your health care provider. Make sure you discuss any questions you have with your health care provider. Document Released: 04/10/2005 Document Revised: 09/21/2015 Document Reviewed: 12/13/2012 Elsevier Interactive Patient Education  2017 [employer]vier Inc.  How to Use a Knee Brace A knee brace is a device that you wear to support your knee, especially if the knee is healing after  an injury or surgery. There are several types of knee braces. Some are designed to prevent an injury (prophylactic brace). These are often worn during sports. Others support an injured knee (functional brace) or keep it still while it heals (rehabilitative brace). People with severe arthritis of the knee may benefit from a brace that takes some pressure off the knee (unloader brace). Most knee braces are made from a combination of cloth and metal or plastic. You may need to wear a knee brace to:  Relieve knee pain.  Help your knee support your weight (improve stability).  Help you walk farther (improve mobility).  Prevent injury.  Support your knee while it heals from surgery or from an injury.  What are the risks? Generally, knee braces are very safe to wear. However, problems may occur, including:  Skin irritation that may lead to infection.  Making your condition worse if you wear the brace in the wrong way.  How to use a knee brace Different braces will have different instructions for use. Your health care provider will tell you or show you:  How to put on your brace.  How to adjust the brace.  When and how often to wear the brace.  How to remove the brace.  If you will need any assistive devices in addition to the brace, such as crutches or a cane.  In general, your brace should:  Have the hinge of the brace line  up with the bend of your knee.  Have straps, hooks, or tapes that fasten snugly around your leg.  Not feel too tight or too loose.  How to care for a knee brace  Check your brace often for signs of damage, such as loose connections or attachments. Your knee brace may get damaged or wear out during normal use.  Wash the fabric parts of your brace with soap and water.  Read the insert that comes with your brace for other specific care instructions. Contact a health care provider if:  Your knee brace is too loose or too tight and you cannot adjust  it.  Your knee brace causes skin redness, swelling, bruising, or irritation.  Your knee brace is not helping.  Your knee brace is making your knee pain worse. This information is not intended to replace advice given to you by your health care provider. Make sure you discuss any questions you have with your health care provider. Document Released: 07/01/2003 Document Revised: 09/16/2015 Document Reviewed: 08/03/2014 Elsevier Interactive Patient Education  [employer].

## 2016-10-16 NOTE — Progress Notes (Signed)
Reviewed xray, yielded the following results: FINDINGS: There is the small poorly marginated density at the superior margin of the left fibula. Otherwise no fracture. No dislocation. No suspicious focal osseous lesion. No joint effusion.  IMPRESSION: Small poorly marginated density at the superior margin of the left fibula, which could represent a small osseous avulsion injury. Otherwise no left knee fracture, joint effusion or malalignment. Consider left knee MRI for further evaluation, as clinically Warranted.   I sent referral to orthopedic specialist for further workup and evaluation. Notified patient, she expressed understanding.   Nolon NationsLaChina Moore Hollis  MSN, FNP-C [facility] [address]  [place], [place] [id number] [phone]

## 2016-10-16 NOTE — Progress Notes (Signed)
Ashley Morton, a 36 year old female presents complaining of left knee pain. Ashley Morton was attending her daughter's graduation on 09/30/2016 and had a syncopal episode resulting in an injury to left knee. She says that she remembers talking to family members and awakened to everyone hovering over her. She was evaluated by EMS and refused to go to the hospital for evaluation.  She injured left knee during fall. She has continued to work 2 jobs that involve standing. Pain has worsened over the past week. Current symptoms include giving out and swelling. Pain is aggravated by any weight bearing, going up and down stairs, lateral movements, standing and walking.  Patient has had no prior knee problems.She was evaluated at urgent care earlier today and given a left knee brace. She says that pain intensity has increased since applying the brace. Pain intensity is 9/10 described as constant and throbbing.     Past Medical History:  Diagnosis Date  . Diabetes mellitus without complication (HCC)   . Hypertension    Social History   Social History  . Marital status: Single    Spouse name: N/A  . Number of children: N/A  . Years of education: N/A   Occupational History  . Not on file.   Social History Main Topics  . Smoking status: Current Every Day Smoker    Packs/day: 1.00    Types: Cigarettes  . Smokeless tobacco: Current User  . Alcohol use 0.0 oz/week     Comment: Occasional  . Drug use: No  . Sexual activity: Yes    Partners: Male   Other Topics Concern  . Not on file   Social History Narrative  . No narrative on file   Immunization History  Administered Date(s) Administered  . Pneumococcal Polysaccharide-23 09/03/2015  . Tdap 08/03/2016   Allergies  Allergen Reactions  . Aspirin Other (See Comments)    States she cannot take the taste of the ASA even the chewable  Review of Systems  Constitutional: Positive for malaise/fatigue. Negative for diaphoresis.  HENT:  Negative.   Eyes: Negative.   Respiratory: Negative.   Cardiovascular: Negative.   Genitourinary: Negative.   Musculoskeletal: Positive for myalgias (Left knee pain).  Skin: Negative.   Neurological: Negative for weakness.  Psychiatric/Behavioral: Negative.   Physical Exam  Constitutional: She appears distressed.  Patient tearful during interview  HENT:  Head: Normocephalic and atraumatic.  Right Ear: External ear normal.  Mouth/Throat: Oropharynx is clear and moist.  Cardiovascular: Normal rate, regular rhythm and normal heart sounds.   Pulmonary/Chest: Effort normal and breath sounds normal.  Abdominal: Soft. Bowel sounds are normal.  Musculoskeletal:       Left knee: She exhibits decreased range of motion and swelling. She exhibits no erythema. Tenderness found. Lateral joint line tenderness noted.  Neurological: She is alert.  Skin: Skin is warm and dry.  Psychiatric: Memory, affect and judgment normal.  Plan  1. History of syncope Will follow up by phone with abnormal laboratory results. Increase water intake to 6-8 glasses of water.  - CBC with Differential - COMPLETE METABOLIC PANEL WITH GFR - HgB [id number]  2. Acute pain of left knee Apply ice to left knee 20 minutes 4 times per day as needed, use interchangeably with warm, moist compresses.  Elevate while at rest.  Will review left knee xray and follow up by phone with results.  - ketorolac (TORADOL) injection 60 mg; Inject 2 mLs (60 mg total) into the muscle once. - DG  Knee Complete 4 Views Left; Future - traMADol (ULTRAM) 50 MG tablet; Take 1 tablet (50 mg total) by mouth every 6 (six) hours as needed.  Dispense: 30 tablet; Refill: 0 - ibuprofen (ADVIL,MOTRIN) 600 MG tablet; Take 1 tablet (600 mg total) by mouth every 8 (eight) hours as needed.  Dispense: 30 tablet; Refill: 0  3. Injury of left knee, subsequent encounter - ketorolac (TORADOL) injection 60 mg; Inject 2 mLs (60 mg total) into the muscle once. Patient may  warrant a referral to orthopedic specialist for further workup and evaluation.    RTC: Will schedule follow up after reviewing laboratory results   Nolon Nations  MSN, FNP-C [facility] Patient [facility] [address] [place], [place] [address] [phone]

## 2016-10-16 NOTE — ED Triage Notes (Addendum)
[facility] left knee pain secondary to a fall that occurred 2 weeks ago.

## 2016-10-17 ENCOUNTER — Ambulatory Visit (INDEPENDENT_AMBULATORY_CARE_PROVIDER_SITE_OTHER): Payer: Medicaid Other | Admitting: Orthopaedic Surgery

## 2016-10-17 ENCOUNTER — Telehealth: Payer: Self-pay

## 2016-10-17 DIAGNOSIS — M25562 Pain in left knee: Secondary | ICD-10-CM | POA: Diagnosis not present

## 2016-10-17 NOTE — Telephone Encounter (Signed)
Patient has an appointment scheduled today at 4:15 with ortho. Thanks!

## 2016-10-17 NOTE — Progress Notes (Signed)
Office Visit Note   Patient: Ashley Morton           Date of Birth: Feb 04, 1981           MRN: [id number] Visit Date: 10/17/2016              Requested by: Massie Maroon, FNP [address] [place], [place] [address] PCP: Massie Maroon, FNP   Assessment & Plan: Visit Diagnoses:  1. Acute pain of left knee     Plan: Overall impression is left knee MCL sprain. Hinged knee brace was provided. Over-the-counter NSAIDs as needed. She is not symptomatically the fibular head. Follow-up with me as needed.  Follow-Up Instructions: Return if symptoms worsen or fail to improve.   Orders:  No orders of the defined types were placed in this encounter.  No orders of the defined types were placed in this encounter.     Procedures: No procedures performed   Clinical Data: No additional findings.   Subjective: Chief Complaint  Patient presents with  . Left Knee - Pain    Patient is a 36 year old female comes in with left knee pain for about 2 weeks ago. She had a syncopal episode from heavy menstrual cycle. She woke up with left knee pain. X-rays were essentially negative. She comes in complaining of medial sided knee pain. She denies any swelling or radiation of pain.    Review of Systems  Constitutional: Negative.   HENT: Negative.   Eyes: Negative.   Respiratory: Negative.   Cardiovascular: Negative.   Endocrine: Negative.   Musculoskeletal: Negative.   Neurological: Negative.   Hematological: Negative.   Psychiatric/Behavioral: Negative.   All other systems reviewed and are negative.    Objective: Vital Signs: LMP 09/24/2016   Physical Exam  Constitutional: She is oriented to person, place, and time. She appears well-developed and well-nourished.  HENT:  Head: Normocephalic and atraumatic.  Eyes: EOM are normal.  Neck: Neck supple.  Pulmonary/Chest: Effort normal.  Abdominal: Soft.  Neurological: She is alert and oriented to person, place,  and time.  Skin: Skin is warm. Capillary refill takes less than 2 seconds.  Psychiatric: She has a normal mood and affect. Her behavior is normal. Judgment and thought content normal.  Nursing note and vitals reviewed.   Ortho Exam Left knee exam shows no joint effusion. Range of motion is without any catching pain. Collaterals and cruciates are stable. Medial joint line tenderness. Specialty Comments:  No specialty comments available.  Imaging: No results found.   PMFS History: Patient Active Problem List   Diagnosis Date Noted  . Essential hypertension 08/03/2016  . History of high blood pressure 09/03/2015  . Prediabetes 09/03/2015  . Menorrhagia with irregular cycle 09/03/2015  . Tobacco dependence 09/03/2015  . Obesity 11/06/2014  . Abnormal uterine bleeding (AUB) 11/06/2014   Past Medical History:  Diagnosis Date  . Diabetes mellitus without complication (HCC)   . Hypertension     Family History  Problem Relation Age of Onset  . Diabetes Maternal Grandmother   . Kidney disease Maternal Grandmother   . Heart disease Maternal Grandmother     Past Surgical History:  Procedure Laterality Date  . SHOULDER SURGERY     Social History   Occupational History  . Not on file.   Social History Main Topics  . Smoking status: Current Every Day Smoker    Packs/day: 1.00    Types: Cigarettes  . Smokeless tobacco: Current User  .  Alcohol use 0.0 oz/week     Comment: Occasional  . Drug use: No  . Sexual activity: Yes    Partners: Male

## 2016-10-19 ENCOUNTER — Encounter (HOSPITAL_COMMUNITY): Payer: Self-pay

## 2016-10-19 ENCOUNTER — Emergency Department (HOSPITAL_COMMUNITY)
Admission: EM | Admit: 2016-10-19 | Discharge: 2016-10-19 | Disposition: A | Discharge status: Home / Self Care | Payer: Medicaid Other | Attending: Emergency Medicine | Admitting: Emergency Medicine

## 2016-10-19 DIAGNOSIS — M545 Low back pain: Secondary | ICD-10-CM | POA: Diagnosis present

## 2016-10-19 DIAGNOSIS — Z5321 Procedure and treatment not carried out due to patient leaving prior to being seen by health care provider: Secondary | ICD-10-CM | POA: Insufficient documentation
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
## 2016-10-19 NOTE — ED Notes (Signed)
Pt came and handled labels to registration and stated she was leaving

## 2016-12-11 ENCOUNTER — Encounter (HOSPITAL_COMMUNITY): Payer: Self-pay | Admitting: Emergency Medicine

## 2016-12-11 ENCOUNTER — Emergency Department (HOSPITAL_COMMUNITY): Payer: Medicaid Other

## 2016-12-11 ENCOUNTER — Emergency Department (HOSPITAL_COMMUNITY)
Admission: EM | Admit: 2016-12-11 | Discharge: 2016-12-11 | Disposition: A | Discharge status: Home / Self Care | Payer: Medicaid Other | Attending: Emergency Medicine | Admitting: Emergency Medicine

## 2016-12-11 DIAGNOSIS — I10 Essential (primary) hypertension: Secondary | ICD-10-CM | POA: Diagnosis not present

## 2016-12-11 DIAGNOSIS — R112 Nausea with vomiting, unspecified: Secondary | ICD-10-CM

## 2016-12-11 DIAGNOSIS — R1013 Epigastric pain: Secondary | ICD-10-CM

## 2016-12-11 DIAGNOSIS — Z79899 Other long term (current) drug therapy: Secondary | ICD-10-CM | POA: Diagnosis not present

## 2016-12-11 DIAGNOSIS — R7303 Prediabetes: Secondary | ICD-10-CM | POA: Diagnosis not present

## 2016-12-11 DIAGNOSIS — K279 Peptic ulcer, site unspecified, unspecified as acute or chronic, without hemorrhage or perforation: Secondary | ICD-10-CM | POA: Diagnosis not present

## 2016-12-11 DIAGNOSIS — F17210 Nicotine dependence, cigarettes, uncomplicated: Secondary | ICD-10-CM | POA: Insufficient documentation

## 2016-12-11 DIAGNOSIS — N390 Urinary tract infection, site not specified: Secondary | ICD-10-CM | POA: Insufficient documentation

## 2016-12-11 LAB — COMPREHENSIVE METABOLIC PANEL
ALK PHOS: 57 U/L (ref 38–126)
ALT: 10 U/L — AB (ref 14–54)
AST: 18 U/L (ref 15–41)
Albumin: 3.9 g/dL (ref 3.5–5.0)
Anion gap: 8 (ref 5–15)
BILIRUBIN TOTAL: 0.3 mg/dL (ref 0.3–1.2)
BUN: 13 mg/dL (ref 6–20)
CALCIUM: 9 mg/dL (ref 8.9–10.3)
CHLORIDE: 108 mmol/L (ref 101–111)
CO2: 23 mmol/L (ref 22–32)
CREATININE: 0.84 mg/dL (ref 0.44–1.00)
Glucose, Bld: 83 mg/dL (ref 65–99)
Potassium: 3.8 mmol/L (ref 3.5–5.1)
Sodium: 139 mmol/L (ref 135–145)
Total Protein: 7 g/dL (ref 6.5–8.1)

## 2016-12-11 LAB — URINALYSIS, ROUTINE W REFLEX MICROSCOPIC
Glucose, UA: NEGATIVE mg/dL
Hgb urine dipstick: NEGATIVE
Ketones, ur: 5 mg/dL — AB
Nitrite: NEGATIVE
PH: 5 (ref 5.0–8.0)
Protein, ur: 30 mg/dL — AB
SPECIFIC GRAVITY, URINE: 1.03 (ref 1.005–1.030)

## 2016-12-11 LAB — LIPASE, BLOOD: LIPASE: 25 U/L (ref 11–51)

## 2016-12-11 LAB — CBC
HCT: 35.6 % — ABNORMAL LOW (ref 36.0–46.0)
Hemoglobin: 11.6 g/dL — ABNORMAL LOW (ref 12.0–15.0)
MCH: 27.3 pg (ref 26.0–34.0)
MCHC: 32.6 g/dL (ref 30.0–36.0)
MCV: 83.8 fL (ref 78.0–100.0)
PLATELETS: 250 10*3/uL (ref 150–400)
RBC: 4.25 MIL/uL (ref 3.87–5.11)
RDW: 17 % — AB (ref 11.5–15.5)
WBC: 8.7 10*3/uL (ref 4.0–10.5)

## 2016-12-11 LAB — POC URINE PREG, ED: PREG TEST UR: NEGATIVE

## 2016-12-11 MED ORDER — ONDANSETRON HCL 4 MG PO TABS: 4.0000 mg | ORAL_TABLET | Freq: Four times a day (QID) | ORAL | 0 refills | Status: DC

## 2016-12-11 MED ORDER — ONDANSETRON HCL 4 MG/2ML IJ SOLN
4.0000 mg | Freq: Once | INTRAMUSCULAR | Status: AC
  Administered 2016-12-11: 4 mg via INTRAVENOUS
  Filled 2016-12-11: qty 2

## 2016-12-11 MED ORDER — ACETAMINOPHEN 500 MG PO TABS: 500.0000 mg | ORAL_TABLET | Freq: Four times a day (QID) | ORAL | 0 refills | Status: DC | PRN

## 2016-12-11 MED ORDER — SUCRALFATE 1 GM/10ML PO SUSP: 1.0000 g | Freq: Three times a day (TID) | ORAL | 0 refills | Status: DC

## 2016-12-11 MED ORDER — PANTOPRAZOLE SODIUM 40 MG PO TBEC: 40.0000 mg | DELAYED_RELEASE_TABLET | Freq: Every day | ORAL | 0 refills | Status: DC

## 2016-12-11 MED ORDER — PANTOPRAZOLE SODIUM 40 MG IV SOLR
40.0000 mg | Freq: Once | INTRAVENOUS | Status: AC
  Administered 2016-12-11: 40 mg via INTRAVENOUS
  Filled 2016-12-11: qty 40

## 2016-12-11 MED ORDER — CEPHALEXIN 500 MG PO CAPS: 500.0000 mg | ORAL_CAPSULE | Freq: Two times a day (BID) | ORAL | 0 refills | Status: DC

## 2016-12-11 MED ORDER — MORPHINE SULFATE (PF) 2 MG/ML IV SOLN
4.0000 mg | Freq: Once | INTRAVENOUS | Status: AC
  Administered 2016-12-11: 4 mg via INTRAVENOUS
  Filled 2016-12-11: qty 2

## 2016-12-11 MED ORDER — SODIUM CHLORIDE 0.9 % IV BOLUS (SEPSIS)
1000.0000 mL | Freq: Once | INTRAVENOUS | Status: AC
  Administered 2016-12-11: 1000 mL via INTRAVENOUS

## 2016-12-11 NOTE — ED Notes (Signed)
Alex PA at bedside, Gave patient ginger ale for PO fluid challenge and will be discharged if patient can tolerate.

## 2016-12-11 NOTE — ED Triage Notes (Signed)
Pt states she has had abdominal pain with N/V x 1.5 weeks. Alert and oriented.

## 2016-12-11 NOTE — ED Notes (Signed)
Ultrasound a bedside.

## 2016-12-11 NOTE — ED Provider Notes (Signed)
[facility]-EMERGENCY DEPT Provider Note   CSN: [id number] Arrival date & time: 12/11/16  1322     History   Chief Complaint Chief Complaint  Patient presents with  . Abdominal Pain    HPI Ashley Morton is a 36 y.o. female with history of hypertension, prediabetes, abnormal uterine bleeding who presents with a ten-day history of epigastric pain with associated nausea and vomiting. Patient has had constant symptoms since onset. Her pain is sharp and does not radiate. She has been had a decreased appetite. She has been able to keep down fluids, however she does feel nauseated with drinking fluids. She reports in the beginning of her symptoms, she saw streaks of blood in her vomit, however has not seen them again. She denies any abnormal bowel movements or diarrhea. She denies any bloody stools. She denies any urinary symptoms. She denies any fevers, chest pain, shortness of breath, abnormal vaginal bleeding or discharge.Patient reports that for the past 3 weeks, she has been taking a lot of ibuprofen, much more than the prescribed dose, for a toothache. She is seeing a dentist now for her dental problem.  HPI  Past Medical History:  Diagnosis Date  . Hypertension     Patient Active Problem List   Diagnosis Date Noted  . Essential hypertension 08/03/2016  . History of high blood pressure 09/03/2015  . Prediabetes 09/03/2015  . Menorrhagia with irregular cycle 09/03/2015  . Tobacco dependence 09/03/2015  . Obesity 11/06/2014  . Abnormal uterine bleeding (AUB) 11/06/2014    Past Surgical History:  Procedure Laterality Date  . SHOULDER SURGERY      OB History    Gravida Para Term Preterm AB Living   4       1 3    SAB TAB Ectopic Multiple Live Births       1   3       Home Medications    Prior to Admission medications   Medication Sig Start Date End Date Taking? Authorizing Provider  hydrochlorothiazide (HYDRODIURIL) 12.5 MG tablet Take 1 tablet (12.5 mg total) by mouth  daily. 08/03/16  Yes Massie Maroon, FNP  ibuprofen (ADVIL,MOTRIN) 600 MG tablet Take 1 tablet (600 mg total) by mouth every 8 (eight) hours as needed. Patient taking differently: Take 600 mg by mouth every 8 (eight) hours as needed for moderate pain.  10/16/16  Yes Massie Maroon, FNP  traMADol (ULTRAM) 50 MG tablet Take 1 tablet (50 mg total) by mouth every 6 (six) hours as needed. Patient taking differently: Take 50 mg by mouth every 6 (six) hours as needed for moderate pain or severe pain.  10/16/16  Yes Massie Maroon, FNP  acetaminophen (TYLENOL) 500 MG tablet Take 1 tablet (500 mg total) by mouth every 6 (six) hours as needed. 12/11/16   Emi Holes, PA-C  cephALEXin (KEFLEX) 500 MG capsule Take 1 capsule (500 mg total) by mouth 2 (two) times daily. 12/11/16   Glennys Schorsch, Waylan Boga, PA-C  ondansetron (ZOFRAN) 4 MG tablet Take 1 tablet (4 mg total) by mouth every 6 (six) hours. 12/11/16   Emi Holes, PA-C  pantoprazole (PROTONIX) 40 MG tablet Take 1 tablet (40 mg total) by mouth daily. 12/11/16   Emi Holes, PA-C  sucralfate (CARAFATE) 1 GM/10ML suspension Take 10 mLs (1 g total) by mouth 4 (four) times daily -  with meals and at bedtime. 12/11/16   Emi Holes, PA-C    Family History Family History  Problem Relation  Age of Onset  . Diabetes Maternal Grandmother   . Kidney disease Maternal Grandmother   . Heart disease Maternal Grandmother     Social History Social History  Substance Use Topics  . Smoking status: Current Every Day Smoker    Packs/day: 1.00    Types: Cigarettes  . Smokeless tobacco: Current User  . Alcohol use 0.0 oz/week     Comment: Occasional     Allergies   Aspirin   Review of Systems Review of Systems  Constitutional: Negative for chills and fever.  HENT: Negative for facial swelling and sore throat.   Respiratory: Negative for shortness of breath.   Cardiovascular: Negative for chest pain.  Gastrointestinal: Positive for  abdominal pain (Epigastric pain), nausea and vomiting. Negative for blood in stool and diarrhea.  Genitourinary: Negative for dysuria.  Musculoskeletal: Negative for back pain.  Skin: Negative for rash and wound.  Neurological: Negative for headaches.  Psychiatric/Behavioral: The patient is not nervous/anxious.      Physical Exam Updated Vital Signs BP (!) 156/95 (BP Location: Right Arm)   Pulse 68   Temp 98.2 F (36.8 C) (Oral)   Resp 18   LMP 11/26/2016 (Approximate)   SpO2 98%   Physical Exam  Constitutional: She appears well-developed and well-nourished. No distress.  HENT:  Head: Normocephalic and atraumatic.  Mouth/Throat: Oropharynx is clear and moist. No oropharyngeal exudate.  Eyes: Pupils are equal, round, and reactive to light. Conjunctivae are normal. Right eye exhibits no discharge. Left eye exhibits no discharge. No scleral icterus.  Neck: Normal range of motion. Neck supple. No thyromegaly present.  Cardiovascular: Normal rate, regular rhythm, normal heart sounds and intact distal pulses.  Exam reveals no gallop and no friction rub.   No murmur heard. Pulmonary/Chest: Effort normal and breath sounds normal. No stridor. No respiratory distress. She has no wheezes. She has no rales.  Abdominal: Soft. Bowel sounds are normal. She exhibits no distension. There is tenderness in the right upper quadrant and epigastric area. There is no rebound, no guarding, no CVA tenderness, no tenderness at McBurney's point and negative Murphy's sign.  Musculoskeletal: She exhibits no edema.  Lymphadenopathy:    She has no cervical adenopathy.  Neurological: She is alert. Coordination normal.  Skin: Skin is warm and dry. No rash noted. She is not diaphoretic. No pallor.  Psychiatric: She has a normal mood and affect.  Nursing note and vitals reviewed.    ED Treatments / Results  Labs (all labs ordered are listed, but only abnormal results are displayed) Labs Reviewed    COMPREHENSIVE METABOLIC PANEL - Abnormal; Notable for the following:       Result Value   ALT 10 (*)    All other components within normal limits  CBC - Abnormal; Notable for the following:    Hemoglobin 11.6 (*)    HCT 35.6 (*)    RDW 17.0 (*)    All other components within normal limits  URINALYSIS, ROUTINE W REFLEX MICROSCOPIC - Abnormal; Notable for the following:    Color, Urine AMBER (*)    APPearance CLOUDY (*)    Bilirubin Urine SMALL (*)    Ketones, ur 5 (*)    Protein, ur 30 (*)    Leukocytes, UA MODERATE (*)    Bacteria, UA MANY (*)    Squamous Epithelial / LPF 6-30 (*)    All other components within normal limits  URINE CULTURE  LIPASE, BLOOD  POC URINE PREG, ED    EKG  EKG Interpretation None       Radiology US Abdomen Limited Ruq  Result Date: 12/11/2016 CLINICAL DATA:  Epigastric abdominal pain with nausea and vomiting. EXAM: ULTRASOUND ABDOMEN LIMITED RIGHT UPPER QUADRANT COMPARISON:  None. FINDINGS: Gallbladder: No gallstones or wall thickening visualized. No sonographic Murphy sign noted by sonographer. Common bile duct: Diameter: 3 mm Liver: No focal lesion identified. Mildly heterogeneous and echogenic liver could be indicative of mild hepatic steatosis. Portal vein is patent on color Doppler imaging with normal direction of blood flow towards the liver. IMPRESSION: 1. Potential mild hepatic steatosis. No focal liver lesion identified. 2. The gallbladder appears unremarkable. Electronically Signed   By: Gaylyn Rong M.D.   On: 12/11/2016 18:15    Procedures Procedures (including critical care time)  Medications Ordered in ED Medications  sodium chloride 0.9 % bolus 1,000 mL (0 mLs Intravenous Stopped 12/11/16 1801)  morphine 2 MG/ML injection 4 mg (4 mg Intravenous Given 12/11/16 1654)  pantoprazole (PROTONIX) injection 40 mg (40 mg Intravenous Given 12/11/16 1654)  ondansetron (ZOFRAN) injection 4 mg (4 mg Intravenous Given 12/11/16 1654)      Initial Impression / Assessment and Plan / ED Course  I have reviewed the triage vital signs and the nursing notes.  Pertinent labs & imaging results that were available during my care of the patient were reviewed by me and considered in my medical decision making (see chart for details).     Patient with probable peptic ulcer disease. CBC with mild anemia, 11.6. CMP unremarkable. Lipase 25. Right upper quadrant ultrasound shows potential mild hepatic steatosis, no focal liver lesion, unremarkable gallbladder. I made patient aware of her hepatic steatosis. Urine pregnancy. UA shows moderate leukocytes, many bacteria, 6-30 squamous epithelial cells. Urine culture sent. We will treat with Keflex, as could be cause of patient's nausea and vomiting. However, I feel patient's NSAID use may have led to peptic ulcer disease. We'll treat with Protonix, Carafate, Zofran, with follow-up to gastroenterology. I advised patient to avoid NSAID use, alcohol, smoking, and other foods that could exacerbate. Return precautions discussed. Patient understands this plan. Patient vitals stable throughout ED course and discharged in satisfactory condition.  Final Clinical Impressions(s) / ED Diagnoses   Final diagnoses:  Epigastric abdominal pain  Nausea and vomiting  Peptic ulcer  Lower urinary tract infectious disease    New Prescriptions Discharge Medication List as of 12/11/2016  6:54 PM    START taking these medications   Details  acetaminophen (TYLENOL) 500 MG tablet Take 1 tablet (500 mg total) by mouth every 6 (six) hours as needed., Starting Mon 12/11/2016, Print    cephALEXin (KEFLEX) 500 MG capsule Take 1 capsule (500 mg total) by mouth 2 (two) times daily., Starting Mon 12/11/2016, Print    ondansetron (ZOFRAN) 4 MG tablet Take 1 tablet (4 mg total) by mouth every 6 (six) hours., Starting Mon 12/11/2016, Print    pantoprazole (PROTONIX) 40 MG tablet Take 1 tablet (40 mg total) by mouth daily.,  Starting Mon 12/11/2016, Print    sucralfate (CARAFATE) 1 GM/10ML suspension Take 10 mLs (1 g total) by mouth 4 (four) times daily -  with meals and at bedtime., Starting Mon 12/11/2016, Print         [place] Lemaster, [place], PA-C 12/11/16 2207    Alvira Monday, MD 12/14/16 2245

## 2016-12-11 NOTE — Discharge Instructions (Signed)
Medications: Protonix, Carafate, Zofran, Keflex, Tylenol  Treatment: Take Protonix once daily. Take Carafate 4 times daily, before eating and before bed. Take Zofran every 6 hours as needed for nausea or vomiting. Take Keflex with food twice daily for your urinary tract infection. Take Tylenol every 6 hours as needed for your pain. Avoid NSAID medication such as ibuprofen, Naprosyn, Aleve. Also avoid alcohol, smoking, spicy foods.  Follow-up: Please follow-up with a gastroenterologist as soon as possible by calling one of the numbers outlined below. Please return to emergency department if you develop any new or worsening symptoms including intractable vomiting, large amounts of bloody vomit, dark, tarry stools, or any other new or concerning symptoms.

## 2016-12-11 NOTE — ED Notes (Signed)
Patient restricted to right arm, this RN stuck patient twice without any success for IV access.

## 2016-12-11 NOTE — ED Notes (Signed)
Patient tolerating ginger ale without any problems.  Patient requesting cup of ice, so gave to patient.

## 2016-12-12 LAB — URINE CULTURE: Culture: <10000 — AB

## 2016-12-14 ENCOUNTER — Encounter: Payer: Self-pay | Admitting: Family Medicine

## 2016-12-14 ENCOUNTER — Other Ambulatory Visit: Payer: Self-pay | Admitting: Family Medicine

## 2016-12-14 DIAGNOSIS — K279 Peptic ulcer, site unspecified, unspecified as acute or chronic, without hemorrhage or perforation: Secondary | ICD-10-CM

## 2016-12-14 NOTE — Progress Notes (Signed)
Ashley Morton, a 36 year old female with treated and evaluated in the emergency department on 12/12/2016 for a peptic ulcer. She call stating that vomiting has worsened. She is requesting a referral to gastroenterology. She says that she does not want to come to primary care for work up due to limited transportation. She says the "emergency department already told me that I need a gi doctor, I don't have transportation to be running back and forth".   Referral sent to gastroenterology.    Nolon Nations  MSN, FNP-C Patient Care [facility] [address] [place], [place] [address] [phone]

## 2016-12-18 ENCOUNTER — Encounter: Payer: Self-pay | Admitting: Internal Medicine

## 2016-12-18 ENCOUNTER — Ambulatory Visit (INDEPENDENT_AMBULATORY_CARE_PROVIDER_SITE_OTHER): Payer: Medicaid Other | Admitting: Internal Medicine

## 2016-12-18 ENCOUNTER — Encounter (INDEPENDENT_AMBULATORY_CARE_PROVIDER_SITE_OTHER): Payer: Self-pay

## 2016-12-18 VITALS — BP 130/82 | HR 89 | Ht 64.0 in | Wt 211.0 lb

## 2016-12-18 DIAGNOSIS — K920 Hematemesis: Secondary | ICD-10-CM

## 2016-12-18 DIAGNOSIS — R1013 Epigastric pain: Secondary | ICD-10-CM | POA: Diagnosis not present

## 2016-12-18 NOTE — Progress Notes (Signed)
Ashley Morton 36 y.o. [birth year] [id number] Referred by: Massie Maroon, FNP  Assessment & Plan:   Encounter Diagnoses  Name Primary?  . Hematemesis with nausea Yes  . Abdominal pain, epigastric     EGDTo investigate the signs and symptoms as appropriate. The risks and benefits as well as alternatives of endoscopic procedure(s) have been discussed and reviewed. All questions answered. The patient agrees to proceed. He have an opening tomorrow and she will take that.  I appreciate the opportunity to care for this patient. CC: Massie Maroon, FNP      Subjective:   Chief Complaint: Abdominal pain in the epigastric area vomiting of blood dark stools  HPI The patient is a pleasant 36 year old African-American woman with a several day several week history of very sharp intense epigastric pain, probably 2 months ago it actually began with intermittent nausea and vomiting. She says that her mouth. With watery fluid and she'll regurgitate or spit up bloody streaked phlegm. She went to the emergency department on August 20. She had a mild stable anemia. She had an abdominal ultrasound that was unrevealing. She has been taking a lot of ibuprofen for dental pain. Has had some dark stools but has been taking Pepto-Bismol Working diagnosis was peptic ulcer PPI and Carafate was prescribed. The patient says she's really been unable to keep her pills down. She's fairly miserable with this intense pain. She has some anorexia. Weight is down as reflected below. Said she had a similar problem 4 months or so ago which spontaneously resolved. That was actually the fall of 2017 when the records are reviewed.  She did have an abnormal urinalysis in the ED so Keflex was prescribed. Pregnancy test was negative. Lipase normal. No elevated chemistries. Hemoglobin 11.6 which is stable there is a mild microcytosis which is chronic.  Wt Readings from Last 3 Encounters:  12/18/16 211 lb (95.7 kg)    10/16/16 219 lb (99.3 kg)  08/03/16 218 lb (98.9 kg)    Allergies  Allergen Reactions  . Aspirin Other (See Comments)    States she cannot take the taste of the ASA even the chewable   Current Meds  Medication Sig  . acetaminophen (TYLENOL) 500 MG tablet Take 1 tablet (500 mg total) by mouth every 6 (six) hours as needed.  . cephALEXin (KEFLEX) 500 MG capsule Take 1 capsule (500 mg total) by mouth 2 (two) times daily.  . hydrochlorothiazide (HYDRODIURIL) 12.5 MG tablet Take 1 tablet (12.5 mg total) by mouth daily.  Marland Kitchen ibuprofen (ADVIL,MOTRIN) 600 MG tablet Take 1 tablet (600 mg total) by mouth every 8 (eight) hours as needed. (Patient taking differently: Take 600 mg by mouth every 8 (eight) hours as needed for moderate pain. )Says she stopped this   . ondansetron (ZOFRAN) 4 MG tablet Take 1 tablet (4 mg total) by mouth every 6 (six) hours.  . pantoprazole (PROTONIX) 40 MG tablet Take 1 tablet (40 mg total) by mouth daily.  . sucralfate (CARAFATE) 1 GM/10ML suspension Take 10 mLs (1 g total) by mouth 4 (four) times daily -  with meals and at bedtime.  . traMADol (ULTRAM) 50 MG tablet Take 1 tablet (50 mg total) by mouth every 6 (six) hours as needed. (Patient taking differently: Take 50 mg by mouth every 6 (six) hours as needed for moderate pain or severe pain. )   Past Medical History:  Diagnosis Date  . Anemia   . GERD (gastroesophageal reflux disease)   . Hypertension   .  Peptic ulcer    Past Surgical History:  Procedure Laterality Date  . SHOULDER SURGERY     Social History   Social History  . Marital status: Single    Spouse name: N/A  . Number of children: 3  . Years of education: N/A   Occupational History  . utility prep and dishwashing [employer]   Social History Main Topics  . Smoking status: Current Every Day Smoker    Packs/day: 1.00    Types: Cigarettes  . Smokeless tobacco: Current User  . Alcohol use 0.0 oz/week     Comment: Occasional  . Drug use: No   . Sexual activity: Yes    Partners: Male    Social History Narrative   Single - 3 sons   [employer] ut[employer] and dishwashing   family history includes Diabetes in her maternal grandmother; Heart disease in her maternal grandmother; Kidney disease in her maternal grandmother.   Review of Systems As per history of present illness. All other review of systems as reported negative at this time.  Objective:   Physical Exam @BP  130/82   Pulse 89   Ht 5\' 4"  (1.626 m)   Wt 211 lb (95.7 kg)   LMP 11/26/2016 (Approximate)   BMI 36.22 kg/m @  General:  Well-developed, well-nourished and in no acute distress Eyes:  anicteric. ENT:   Mouth and posterior pharynx free of lesions.  Neck:   supple w/o thyromegaly or mass.  Lungs: Clear to auscultation bilaterally. Heart:  S1S2, no rubs, murmurs, gallops. Abdomen:  soft, mild-mod tender epigastrium, no hepatosplenomegaly, hernia, or mass and BS+.  Lymph:  no cervical or supraclavicular adenopathy. Extremities:   no edema, cyanosis or clubbing Skin   no rash. Numerous tattoos Neuro:  A&O x 3.  Psych:  appropriate mood and  Affect.   Data Reviewed:  As per history of present illness

## 2016-12-18 NOTE — Patient Instructions (Addendum)
If you are age 37 or younger, your body mass index should be between 19-25. Your Body mass index is 36.22 kg/m. If this is out of the aformentioned range listed, please consider follow up with your Primary Care Provider.    You have been scheduled for an endoscopy. Please follow written instructions given to you at your visit today. If you use inhalers (even only as needed), please bring them with you on the day of your procedure. Your physician has requested that you go to www.startemmi.com and enter the access code given to you at your visit today. This web site gives a general overview about your procedure. However, you should still follow specific instructions given to you by our office regarding your preparation for the procedure.   We will give you a work note for today and  tomorrow.   I appreciate the opportunity to care for you. Stan Head, MD, [facility]

## 2016-12-19 ENCOUNTER — Ambulatory Visit (AMBULATORY_SURGERY_CENTER): Payer: Medicaid Other | Admitting: Internal Medicine

## 2016-12-19 ENCOUNTER — Encounter: Payer: Self-pay | Admitting: Internal Medicine

## 2016-12-19 VITALS — BP 173/81 | HR 74 | Temp 97.8°F | Resp 15 | Ht 64.0 in | Wt 211.0 lb

## 2016-12-19 DIAGNOSIS — B9689 Other specified bacterial agents as the cause of diseases classified elsewhere: Secondary | ICD-10-CM | POA: Diagnosis not present

## 2016-12-19 DIAGNOSIS — K3189 Other diseases of stomach and duodenum: Secondary | ICD-10-CM | POA: Diagnosis not present

## 2016-12-19 DIAGNOSIS — K920 Hematemesis: Secondary | ICD-10-CM | POA: Diagnosis not present

## 2016-12-19 DIAGNOSIS — K2901 Acute gastritis with bleeding: Secondary | ICD-10-CM | POA: Diagnosis not present

## 2016-12-19 DIAGNOSIS — K2960 Other gastritis without bleeding: Secondary | ICD-10-CM

## 2016-12-19 MED ORDER — SODIUM CHLORIDE 0.9 % IV SOLN: 500.0000 mL | INTRAVENOUS | Status: DC

## 2016-12-19 MED ORDER — ONDANSETRON HCL 4 MG PO TABS: 4.0000 mg | ORAL_TABLET | Freq: Four times a day (QID) | ORAL | 0 refills | Status: DC | PRN

## 2016-12-19 NOTE — Op Note (Signed)
[facility] Patient Name: Ashley Morton Procedure Date: 12/19/2016 2:37 PM MRN: [id number] Endoscopist: Iva Boop , MD Age: 36 Referring MD:  Date of Birth: 05-07-1980 Gender: Female Account #: [id number] Procedure:                Upper GI endoscopy Indications:              Epigastric abdominal pain, Hematemesis Medicines:                Propofol per Anesthesia, Monitored Anesthesia Care Procedure:                Pre-Anesthesia Assessment:                           - Prior to the procedure, a History and Physical                            was performed, and patient medications and                            allergies were reviewed. The patient's tolerance of                            previous anesthesia was also reviewed. The risks                            and benefits of the procedure and the sedation                            options and risks were discussed with the patient.                            All questions were answered, and informed consent                            was obtained. Prior Anticoagulants: The patient has                            taken no previous anticoagulant or antiplatelet                            agents. ASA Grade Assessment: II - A patient with                            mild systemic disease. After reviewing the risks                            and benefits, the patient was deemed in                            satisfactory condition to undergo the procedure.                           After obtaining informed consent, the endoscope was  passed under direct vision. Throughout the                            procedure, the patient's blood pressure, pulse, and                            oxygen saturations were monitored continuously. The                            Model GIF-[id number]) scope was introduced                            through the mouth, and advanced to the second part              of duodenum. The upper GI endoscopy was                            accomplished without difficulty. The patient                            tolerated the procedure well. Scope In: Scope Out: Findings:                 Multiple dispersed, small non-bleeding erosions                            were found in the gastric antrum. There were                            stigmata of recent bleeding. Biopsies were taken                            with a cold forceps for histology. Verification of                            patient identification for the specimen was done.                            Estimated blood loss was minimal.                           The exam was otherwise without abnormality.                           The cardia and gastric fundus were normal on                            retroflexion. Complications:            No immediate complications. Estimated Blood Loss:     Estimated blood loss was minimal. Impression:               - Non-bleeding erosive gastropathy. Biopsied.                           - The examination was otherwise normal. Recommendation:           -  Patient has a contact number available for                            emergencies. The signs and symptoms of potential                            delayed complications were discussed with the                            patient. Return to normal activities tomorrow.                            Written discharge instructions were provided to the                            patient.                           - Resume previous diet.                           - Continue present medications.                           - Await pathology results.                           - Stop ibuprofen                           Refilled ondansetron for nausea and vomiting Iva Boop, MD 12/19/2016 2:52:10 PM This report has been signed electronically.

## 2016-12-19 NOTE — Progress Notes (Signed)
A and O x3. Report to RN. Tolerated MAC anesthesia well.Teeth unchanged after procedure.

## 2016-12-19 NOTE — Progress Notes (Signed)
Pt's states no medical or surgical changes since previsit or office visit. 

## 2016-12-19 NOTE — Progress Notes (Signed)
Called to room to assist during endoscopic procedure.  Patient ID and intended procedure confirmed with present staff. Received instructions for my participation in the procedure from the performing physician.  

## 2016-12-19 NOTE — Patient Instructions (Addendum)
The stomach lining is irritated and inflamed - "erosive gastritis"  Probably from ibuprofen. I took biopsies to see if there is infection.  Please stay on the pantoprazole and sucralfate.  I am refilling ondansetron to take for nausea and vomiting.  This should get better over time.  You need to stop ibuprofen and use the Tramadol (ultram) or acetaminophen for pain.  I appreciate the opportunity to care for you. Iva Boop, MD, FACG    YOU HAD AN ENDOSCOPIC PROCEDURE TODAY AT THE [facility]:   Refer to the procedure report that was given to you for any specific questions about what was found during the examination.  If the procedure report does not answer your questions, please call your gastroenterologist to clarify.  If you requested that your care partner not be given the details of your procedure findings, then the procedure report has been included in a sealed envelope for you to review at your convenience later.  YOU SHOULD EXPECT: Some feelings of bloating in the abdomen. Passage of more gas than usual.  Walking can help get rid of the air that was put into your GI tract during the procedure and reduce the bloating. If you had a lower endoscopy (such as a colonoscopy or flexible sigmoidoscopy) you may notice spotting of blood in your stool or on the toilet paper. If you underwent a bowel prep for your procedure, you may not have a normal bowel movement for a few days.  Please Note:  You might notice some irritation and congestion in your nose or some drainage.  This is from the oxygen used during your procedure.  There is no need for concern and it should clear up in a day or so.  SYMPTOMS TO REPORT IMMEDIATELY:    Following upper endoscopy (EGD)  Vomiting of blood or coffee ground material  New chest pain or pain under the shoulder blades  Painful or persistently difficult swallowing  New shortness of breath  Fever of 100F or higher  Black,  tarry-looking stools  For urgent or emergent issues, a gastroenterologist can be reached at any hour by calling [phone]. Please read handouts given to you by your recovery nurse.  DIET:  We do recommend a small meal at first, but then you may proceed to your regular diet.  Drink plenty of fluids but you should avoid alcoholic beverages for 24 hours.  ACTIVITY:  You should plan to take it easy for the rest of today and you should NOT DRIVE or use heavy machinery until tomorrow (because of the sedation medicines used during the test).    FOLLOW UP: Our staff will call the number listed on your records the next business day following your procedure to check on you and address any questions or concerns that you may have regarding the information given to you following your procedure. If we do not reach you, we will leave a message.  However, if you are feeling well and you are not experiencing any problems, there is no need to return our call.  We will assume that you have returned to your regular daily activities without incident.  If any biopsies were taken you will be contacted by phone or by letter within the next 1-3 weeks.  Please call us at [phone] if you have not heard about the biopsies in 3 weeks.    SIGNATURES/CONFIDENTIALITY: You and/or your care partner have signed paperwork which will be entered into your electronic medical  record.  These signatures attest to the fact that that the information above on your After Visit Summary has been reviewed and is understood.  Full responsibility of the confidentiality of this discharge information lies with you and/or your care-partner.  Thank you for letting us take care of your healthcare needs today.

## 2016-12-20 ENCOUNTER — Telehealth: Payer: Self-pay | Admitting: *Deleted

## 2016-12-20 ENCOUNTER — Telehealth: Payer: Self-pay

## 2016-12-20 NOTE — Telephone Encounter (Signed)
  Follow up Call-  Call back number 12/19/2016  Post procedure Call Back phone  # [phone]  Permission to leave phone message Yes  Some recent data might be hidden     Patient questions:  Do you have a fever, pain , or abdominal swelling? No. Pain Score  0 *  Have you tolerated food without any problems? Yes.    Have you been able to return to your normal activities? Yes.    Do you have any questions about your discharge instructions: Diet   No. Medications  No. Follow up visit  No.  Do you have questions or concerns about your Care? No.  Actions: * If pain score is 4 or above: No action needed, pain <4.

## 2016-12-20 NOTE — Telephone Encounter (Signed)
No answer, left message to call if questions or concerns. 

## 2016-12-26 ENCOUNTER — Encounter: Payer: Self-pay | Admitting: Internal Medicine

## 2016-12-26 DIAGNOSIS — B9681 Helicobacter pylori [H. pylori] as the cause of diseases classified elsewhere: Secondary | ICD-10-CM

## 2016-12-26 DIAGNOSIS — K2970 Gastritis, unspecified, without bleeding: Secondary | ICD-10-CM

## 2016-12-26 HISTORY — DX: Helicobacter pylori (H. pylori) as the cause of diseases classified elsewhere: B96.81

## 2016-12-26 NOTE — Progress Notes (Signed)
Please let her know she has h pylori stomach infection causing gastritis - inflammation  I want her to take: 1) pantoprazole 40 mg bid x 14 d 2) Pepto Bismol 2 tabs (262 mg each) 4 times a day x 14 d 3) Metronidazole 250 mg 4 times a day x 14 d 4) doxycycline 100 mg 2 times a day x 14 d  After 14 d stop pantoprazole also  In 4 weeks after treatment completed do H. Pylori stool antigen - dx H. Pylori gastritis

## 2016-12-26 NOTE — Progress Notes (Signed)
No recall or letter To be called from office

## 2016-12-27 ENCOUNTER — Other Ambulatory Visit: Payer: Self-pay

## 2016-12-27 DIAGNOSIS — A048 Other specified bacterial intestinal infections: Secondary | ICD-10-CM

## 2016-12-27 MED ORDER — PANTOPRAZOLE SODIUM 40 MG PO TBEC: 40.0000 mg | DELAYED_RELEASE_TABLET | Freq: Two times a day (BID) | ORAL | 3 refills | Status: DC

## 2016-12-27 MED ORDER — BISMUTH SUBSALICYLATE 262 MG PO TABS: 2.0000 | ORAL_TABLET | Freq: Four times a day (QID) | ORAL | 0 refills | Status: AC

## 2016-12-27 MED ORDER — METRONIDAZOLE 250 MG PO TABS: 250.0000 mg | ORAL_TABLET | Freq: Four times a day (QID) | ORAL | 0 refills | Status: AC

## 2016-12-27 MED ORDER — DOXYCYCLINE HYCLATE 100 MG PO CAPS: 100.0000 mg | ORAL_CAPSULE | Freq: Two times a day (BID) | ORAL | 0 refills | Status: AC

## 2016-12-28 ENCOUNTER — Telehealth: Payer: Self-pay | Admitting: Internal Medicine

## 2016-12-29 MED ORDER — ONDANSETRON HCL 4 MG PO TABS: 4.0000 mg | ORAL_TABLET | Freq: Four times a day (QID) | ORAL | 0 refills | Status: DC | PRN

## 2016-12-29 NOTE — Telephone Encounter (Signed)
I discussed with her that the h pylori treatment is difficult at times to take.  We discussed that she needs to take the medications with food, try and separate the doxycycline and metronidazole.  I sent her in a refill of zofran.  She will do the best she can and call me back if she has any additional problems or concerns.

## 2017-02-07 ENCOUNTER — Other Ambulatory Visit: Payer: Self-pay

## 2017-02-13 ENCOUNTER — Other Ambulatory Visit: Payer: Self-pay

## 2017-02-13 DIAGNOSIS — A048 Other specified bacterial intestinal infections: Secondary | ICD-10-CM

## 2017-02-14 LAB — HELICOBACTER PYLORI  SPECIAL ANTIGEN
MICRO NUMBER: 81186216
SPECIMEN QUALITY: ADEQUATE

## 2017-02-15 ENCOUNTER — Encounter: Payer: Self-pay | Admitting: Internal Medicine

## 2017-02-15 NOTE — Progress Notes (Signed)
H pylori is gone F/U PRN Please ask if she has any persistent sxs and let me know if so

## 2017-03-17 ENCOUNTER — Encounter (HOSPITAL_COMMUNITY): Payer: Self-pay | Admitting: Nurse Practitioner

## 2017-03-17 DIAGNOSIS — Y999 Unspecified external cause status: Secondary | ICD-10-CM | POA: Diagnosis not present

## 2017-03-17 DIAGNOSIS — Z79899 Other long term (current) drug therapy: Secondary | ICD-10-CM | POA: Diagnosis not present

## 2017-03-17 DIAGNOSIS — Y92411 Interstate highway as the place of occurrence of the external cause: Secondary | ICD-10-CM | POA: Insufficient documentation

## 2017-03-17 DIAGNOSIS — Y9389 Activity, other specified: Secondary | ICD-10-CM | POA: Diagnosis not present

## 2017-03-17 DIAGNOSIS — M546 Pain in thoracic spine: Secondary | ICD-10-CM | POA: Diagnosis not present

## 2017-03-17 DIAGNOSIS — F17210 Nicotine dependence, cigarettes, uncomplicated: Secondary | ICD-10-CM | POA: Insufficient documentation

## 2017-03-17 DIAGNOSIS — S161XXA Strain of muscle, fascia and tendon at neck level, initial encounter: Secondary | ICD-10-CM | POA: Insufficient documentation

## 2017-03-17 DIAGNOSIS — I10 Essential (primary) hypertension: Secondary | ICD-10-CM | POA: Insufficient documentation

## 2017-03-17 DIAGNOSIS — S199XXA Unspecified injury of neck, initial encounter: Secondary | ICD-10-CM | POA: Diagnosis present

## 2017-03-17 NOTE — ED Notes (Signed)
Pt states she is having slight neck pain.
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
## 2017-03-18 ENCOUNTER — Emergency Department (HOSPITAL_COMMUNITY)
Admission: EM | Admit: 2017-03-18 | Discharge: 2017-03-18 | Disposition: A | Discharge status: Home / Self Care | Payer: Medicaid Other | Attending: Emergency Medicine | Admitting: Emergency Medicine

## 2017-03-18 DIAGNOSIS — S161XXA Strain of muscle, fascia and tendon at neck level, initial encounter: Secondary | ICD-10-CM

## 2017-03-18 MED ORDER — IBUPROFEN 200 MG PO TABS
400.0000 mg | ORAL_TABLET | Freq: Once | ORAL | Status: AC
  Administered 2017-03-18: 400 mg via ORAL
  Filled 2017-03-18: qty 2
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
## 2017-03-22 ENCOUNTER — Ambulatory Visit (HOSPITAL_COMMUNITY)
Admission: EM | Admit: 2017-03-22 | Discharge: 2017-03-22 | Disposition: A | Discharge status: Home / Self Care | Payer: PRIVATE HEALTH INSURANCE | Attending: Emergency Medicine | Admitting: Emergency Medicine

## 2017-03-22 ENCOUNTER — Encounter (HOSPITAL_COMMUNITY): Payer: Self-pay | Admitting: Emergency Medicine

## 2017-03-22 ENCOUNTER — Other Ambulatory Visit: Payer: Self-pay

## 2017-03-22 DIAGNOSIS — M5431 Sciatica, right side: Secondary | ICD-10-CM

## 2017-03-22 DIAGNOSIS — M5441 Lumbago with sciatica, right side: Secondary | ICD-10-CM

## 2017-03-22 MED ORDER — PREDNISONE 10 MG (21) PO TBPK: ORAL_TABLET | Freq: Every day | ORAL | 0 refills | Status: DC
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
## 2017-03-22 NOTE — Discharge Instructions (Signed)
May continue with tylenol as needed for pain.  With your ulcer history we will try a course of steroids to try to help with low back pain which radiates. Please follow up with your primary care provider in the next 1-2 weeks for a recheck. You may need physical therapy or further imaging in the future. If you develop weakness, loss of sensation or bowel or bladder function please return sooner to be seen.
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
## 2017-03-26 ENCOUNTER — Encounter (HOSPITAL_COMMUNITY): Payer: Self-pay | Admitting: Emergency Medicine

## 2017-03-26 ENCOUNTER — Emergency Department (HOSPITAL_COMMUNITY)
Admission: EM | Admit: 2017-03-26 | Discharge: 2017-03-26 | Disposition: A | Discharge status: Home / Self Care | Payer: Medicaid Other | Attending: Emergency Medicine | Admitting: Emergency Medicine

## 2017-03-26 ENCOUNTER — Other Ambulatory Visit: Payer: Self-pay

## 2017-03-26 ENCOUNTER — Emergency Department (HOSPITAL_COMMUNITY): Payer: Medicaid Other

## 2017-03-26 DIAGNOSIS — F17210 Nicotine dependence, cigarettes, uncomplicated: Secondary | ICD-10-CM | POA: Insufficient documentation

## 2017-03-26 DIAGNOSIS — M545 Low back pain, unspecified: Secondary | ICD-10-CM

## 2017-03-26 DIAGNOSIS — M79604 Pain in right leg: Secondary | ICD-10-CM | POA: Insufficient documentation

## 2017-03-26 DIAGNOSIS — Z3202 Encounter for pregnancy test, result negative: Secondary | ICD-10-CM | POA: Diagnosis not present

## 2017-03-26 DIAGNOSIS — I10 Essential (primary) hypertension: Secondary | ICD-10-CM | POA: Diagnosis not present

## 2017-03-26 DIAGNOSIS — Z79899 Other long term (current) drug therapy: Secondary | ICD-10-CM | POA: Insufficient documentation

## 2017-03-26 LAB — POC URINE PREG, ED: Preg Test, Ur: NEGATIVE

## 2017-03-26 MED ORDER — DIAZEPAM 5 MG PO TABS
5.0000 mg | ORAL_TABLET | Freq: Once | ORAL | Status: AC
  Administered 2017-03-26: 5 mg via ORAL
  Filled 2017-03-26: qty 1

## 2017-03-26 MED ORDER — CYCLOBENZAPRINE HCL 10 MG PO TABS: 10.0000 mg | ORAL_TABLET | Freq: Two times a day (BID) | ORAL | 0 refills | Status: DC | PRN

## 2017-03-26 MED ORDER — DIAZEPAM 5 MG/ML IJ SOLN
5.0000 mg | Freq: Once | INTRAMUSCULAR | Status: DC
  Filled 2017-03-26: qty 2

## 2017-03-26 MED ORDER — KETOROLAC TROMETHAMINE 60 MG/2ML IM SOLN
60.0000 mg | Freq: Once | INTRAMUSCULAR | Status: AC
  Administered 2017-03-26: 60 mg via INTRAMUSCULAR
  Filled 2017-03-26: qty 2

## 2017-03-26 NOTE — Discharge Instructions (Signed)
Take the prescribed medication as directed.  Can continue your home pain medications. Follow-up with your primary care doctor. Return to the ED for new or worsening symptoms.

## 2017-03-26 NOTE — ED Notes (Addendum)
Pt c/o sharp pain in the right calf that shoots up the right leg and some numbness in the feet that started this evening. Bilateral pedal pulses present, good movement and sensation in the feet.
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
## 2017-03-26 NOTE — ED Notes (Signed)
No respiratory or acute distress noted alert and oriented x 3 call light in reach no reaction to medication noted. 

## 2017-03-26 NOTE — ED Triage Notes (Signed)
Patient complaining of right and left leg pain. Patient states that her legs are having some pain because they are waking her up out of her sleep. She says both of her feet are having some tingling feeling. Patient is also complaining of lower back pain.

## 2017-04-09 ENCOUNTER — Ambulatory Visit (INDEPENDENT_AMBULATORY_CARE_PROVIDER_SITE_OTHER): Payer: Medicaid Other | Admitting: Family Medicine

## 2017-04-09 ENCOUNTER — Encounter: Payer: Self-pay | Admitting: Family Medicine

## 2017-04-09 VITALS — BP 152/84 | HR 85 | Temp 98.9°F | Ht 64.0 in | Wt 215.0 lb

## 2017-04-09 DIAGNOSIS — M533 Sacrococcygeal disorders, not elsewhere classified: Secondary | ICD-10-CM

## 2017-04-09 DIAGNOSIS — N898 Other specified noninflammatory disorders of vagina: Secondary | ICD-10-CM | POA: Diagnosis not present

## 2017-04-09 DIAGNOSIS — I10 Essential (primary) hypertension: Secondary | ICD-10-CM

## 2017-04-09 DIAGNOSIS — K6289 Other specified diseases of anus and rectum: Secondary | ICD-10-CM

## 2017-04-09 LAB — POCT URINE PREGNANCY: PREG TEST UR: NEGATIVE

## 2017-04-09 MED ORDER — HYDROCHLOROTHIAZIDE 25 MG PO TABS: 25.0000 mg | ORAL_TABLET | Freq: Every day | ORAL | 5 refills | Status: AC

## 2017-04-09 MED ORDER — GABAPENTIN 100 MG PO CAPS: 100.0000 mg | ORAL_CAPSULE | Freq: Three times a day (TID) | ORAL | 0 refills | Status: DC

## 2017-04-09 NOTE — Patient Instructions (Addendum)
Sacral pain-start a trial of Gabapentin 100 mg three times per day. Follow up on 04/26/2017. Recommend warm tub soaks to assist with sacral pain.   Will follow up by phone with any abnormal laboratory results
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
## 2017-04-10 ENCOUNTER — Encounter: Payer: Self-pay | Admitting: Family Medicine

## 2017-04-10 ENCOUNTER — Ambulatory Visit (HOSPITAL_COMMUNITY)
Admission: RE | Admit: 2017-04-10 | Discharge: 2017-04-10 | Disposition: A | Discharge status: Home / Self Care | Payer: Medicaid Other | Source: Ambulatory Visit | Attending: Family Medicine | Admitting: Family Medicine

## 2017-04-10 ENCOUNTER — Telehealth: Payer: Self-pay | Admitting: Family Medicine

## 2017-04-10 DIAGNOSIS — M533 Sacrococcygeal disorders, not elsewhere classified: Secondary | ICD-10-CM | POA: Insufficient documentation

## 2017-04-10 DIAGNOSIS — K6289 Other specified diseases of anus and rectum: Secondary | ICD-10-CM | POA: Diagnosis not present

## 2017-04-10 LAB — CBC
Hematocrit: 31.4 % — ABNORMAL LOW (ref 34.0–46.6)
Hemoglobin: 10.4 g/dL — ABNORMAL LOW (ref 11.1–15.9)
MCH: 26.6 pg (ref 26.6–33.0)
MCHC: 33.1 g/dL (ref 31.5–35.7)
MCV: 80 fL (ref 79–97)
PLATELETS: 274 10*3/uL (ref 150–379)
RBC: 3.91 x10E6/uL (ref 3.77–5.28)
RDW: 17.4 % — ABNORMAL HIGH (ref 12.3–15.4)
WBC: 8.7 10*3/uL (ref 3.4–10.8)

## 2017-04-10 LAB — BASIC METABOLIC PANEL
BUN / CREAT RATIO: 14 (ref 9–23)
BUN: 10 mg/dL (ref 6–20)
CO2: 23 mmol/L (ref 20–29)
CREATININE: 0.74 mg/dL (ref 0.57–1.00)
Calcium: 9.1 mg/dL (ref 8.7–10.2)
Chloride: 104 mmol/L (ref 96–106)
GFR calc Af Amer: 120 mL/min/{1.73_m2} (ref >59)
GFR, EST NON AFRICAN AMERICAN: 104 mL/min/{1.73_m2} (ref >59)
Glucose: 98 mg/dL (ref 65–99)
POTASSIUM: 3.8 mmol/L (ref 3.5–5.2)
SODIUM: 141 mmol/L (ref 134–144)

## 2017-04-10 NOTE — ED Triage Notes (Addendum)
Patient is complaining of rectal pain with no rectal bleeding that started on 12/12. Denies nausea, vomiting, diarrhea, or fever. Patient has been seen by PCP for same symptoms.

## 2017-04-10 NOTE — Telephone Encounter (Signed)
Patient phoned the answering service request a provider return her call as she is experiencing excruciating abdominal pain. She left a return phone number of [phone]. Phone call was returned and a unidentified female answered the phone advised patient could not come to the phone as she was taking a shower and they were on their way to the Emergency Department. Advised that would be my recommendation if patient's pain is excruciating.

## 2017-04-11 ENCOUNTER — Encounter (HOSPITAL_COMMUNITY): Payer: Self-pay | Admitting: Family Medicine

## 2017-04-11 ENCOUNTER — Emergency Department (HOSPITAL_COMMUNITY): Payer: Medicaid Other

## 2017-04-11 ENCOUNTER — Emergency Department (HOSPITAL_COMMUNITY)
Admission: EM | Admit: 2017-04-11 | Discharge: 2017-04-11 | Disposition: A | Discharge status: Home / Self Care | Payer: Medicaid Other | Attending: Emergency Medicine | Admitting: Emergency Medicine

## 2017-04-11 DIAGNOSIS — K6289 Other specified diseases of anus and rectum: Secondary | ICD-10-CM

## 2017-04-11 LAB — BASIC METABOLIC PANEL
ANION GAP: 7 (ref 5–15)
BUN: 12 mg/dL (ref 6–20)
CO2: 26 mmol/L (ref 22–32)
Calcium: 8.5 mg/dL — ABNORMAL LOW (ref 8.9–10.3)
Chloride: 104 mmol/L (ref 101–111)
Creatinine, Ser: 0.68 mg/dL (ref 0.44–1.00)
Glucose, Bld: 106 mg/dL — ABNORMAL HIGH (ref 65–99)
POTASSIUM: 3.4 mmol/L — AB (ref 3.5–5.1)
SODIUM: 137 mmol/L (ref 135–145)

## 2017-04-11 LAB — CBC WITH DIFFERENTIAL/PLATELET
BASOS ABS: 0 10*3/uL (ref 0.0–0.1)
BASOS PCT: 1 %
EOS ABS: 0.1 10*3/uL (ref 0.0–0.7)
EOS PCT: 2 %
HCT: 30.3 % — ABNORMAL LOW (ref 36.0–46.0)
HEMOGLOBIN: 9.6 g/dL — AB (ref 12.0–15.0)
Lymphocytes Relative: 36 %
Lymphs Abs: 3.2 10*3/uL (ref 0.7–4.0)
MCH: 26.9 pg (ref 26.0–34.0)
MCHC: 31.7 g/dL (ref 30.0–36.0)
MCV: 84.9 fL (ref 78.0–100.0)
Monocytes Absolute: 0.8 10*3/uL (ref 0.1–1.0)
Monocytes Relative: 9 %
Neutro Abs: 4.6 10*3/uL (ref 1.7–7.7)
Neutrophils Relative %: 52 %
PLATELETS: 233 10*3/uL (ref 150–400)
RBC: 3.57 MIL/uL — AB (ref 3.87–5.11)
RDW: 16.5 % — ABNORMAL HIGH (ref 11.5–15.5)
WBC: 8.7 10*3/uL (ref 4.0–10.5)

## 2017-04-11 LAB — VAGINITIS/VAGINOSIS, DNA PROBE
CANDIDA SPECIES: NEGATIVE
Gardnerella vaginalis: NEGATIVE
TRICHOMONAS VAG: NEGATIVE

## 2017-04-11 LAB — POCT PREGNANCY, URINE: PREG TEST UR: NEGATIVE

## 2017-04-11 MED ORDER — IOPAMIDOL (ISOVUE-300) INJECTION 61%
INTRAVENOUS | Status: AC
  Administered 2017-04-11: 100 mL via INTRAVENOUS
  Filled 2017-04-11: qty 100

## 2017-04-11 MED ORDER — IOPAMIDOL (ISOVUE-300) INJECTION 61%
100.0000 mL | Freq: Once | INTRAVENOUS | Status: AC | PRN
  Administered 2017-04-11: 100 mL via INTRAVENOUS

## 2017-04-11 MED ORDER — PREDNISONE 20 MG PO TABS: 40.0000 mg | ORAL_TABLET | Freq: Every day | ORAL | 0 refills | Status: DC

## 2017-04-11 MED ORDER — KETOROLAC TROMETHAMINE 30 MG/ML IJ SOLN
30.0000 mg | Freq: Once | INTRAMUSCULAR | Status: AC
  Administered 2017-04-11: 30 mg via INTRAVENOUS
  Filled 2017-04-11: qty 1

## 2017-04-11 MED ORDER — HYDROMORPHONE HCL 1 MG/ML IJ SOLN
1.0000 mg | Freq: Once | INTRAMUSCULAR | Status: AC
  Administered 2017-04-11: 1 mg via INTRAVENOUS
  Filled 2017-04-11: qty 1
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
## 2017-04-11 NOTE — Discharge Instructions (Signed)
Your workup in the emergency department was reassuring and did not show a life-threatening cause of your pain today.  We recommend that you take prednisone as prescribed.  Continue Neurontin as prescribed by your primary care doctor.  You may benefit from massaging Salonpas to the area.  This can be purchased over-the-counter at your local pharmacy.  Follow-up with your primary care doctor as scheduled on 04/26/2016.  You may return to the emergency department as needed for new or concerning symptoms.

## 2017-04-26 ENCOUNTER — Ambulatory Visit: Payer: Medicaid Other | Admitting: Family Medicine

## 2017-04-26 ENCOUNTER — Encounter: Payer: Self-pay | Admitting: Family Medicine

## 2017-04-26 VITALS — BP 140/82 | HR 98 | Temp 98.5°F | Resp 16 | Ht 64.0 in | Wt 212.0 lb

## 2017-04-26 DIAGNOSIS — I10 Essential (primary) hypertension: Secondary | ICD-10-CM

## 2017-04-26 DIAGNOSIS — F17200 Nicotine dependence, unspecified, uncomplicated: Secondary | ICD-10-CM | POA: Diagnosis not present

## 2017-04-26 DIAGNOSIS — N921 Excessive and frequent menstruation with irregular cycle: Secondary | ICD-10-CM | POA: Diagnosis not present

## 2017-04-26 LAB — POCT URINALYSIS DIP (DEVICE)
BILIRUBIN URINE: NEGATIVE
GLUCOSE, UA: NEGATIVE mg/dL
KETONES UR: NEGATIVE mg/dL
Leukocytes, UA: NEGATIVE
NITRITE: NEGATIVE
PH: 5.5 (ref 5.0–8.0)
Protein, ur: NEGATIVE mg/dL
Specific Gravity, Urine: >=1.03 (ref 1.005–1.030)
Urobilinogen, UA: 0.2 mg/dL (ref 0.0–1.0)

## 2017-04-26 NOTE — Progress Notes (Signed)
Subjective:    Ashley Morton is a 37 y.o. female who presents for follow up of rectal/sacral pain and hypertension. Patient was evaluated in the emergency department on 04/11/2017. Patient was treated with opiate medication. She says that it worsened on last week. Previously rectal pain was worsened by prolonged standing and typically radiates to thighs bilaterally. She say that pain has resolved.  Patient denies fatigue, dysuria, incontinence, constipation, nausea, vomiting or diarrhea.    Patient also here for follow-up of hypertension.  Ashley Morton does not exercise routinely or follow a low sodium diet. Patient does not check blood pressures at home. Patient denies chest pain, dyspnea, fatigue, orthopnea, palpitations and syncope.  Cardiovascular risk factors: obesity (BMI >= 30 kg/m2) and smoking/ tobacco exposure.   Past Medical History:  Diagnosis Date  . Anemia   . GERD (gastroesophageal reflux disease)   . Helicobacter pylori gastritis 12/26/2016   Tx and eradicated  . Hypertension   . Peptic ulcer    Social History   Socioeconomic History  . Marital status: Single    Spouse name: Not on file  . Number of children: 3  . Years of education: Not on file  . Highest education level: Not on file  Social Needs  . Financial resource strain: Not on file  . Food insecurity - worry: Not on file  . Food insecurity - inability: Not on file  . Transportation needs - medical: Not on file  . Transportation needs - non-medical: Not on file  Occupational History  . 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 Smoking status: Current Every Day Smoker    Packs/day: 1.00    Types: Cigarettes  . Smokeless tobacco: Current User  Substance and Sexual Activity  . Alcohol use: Yes    Alcohol/week: 0.0 oz    Comment: Once a week.   . Drug use: No  . Sexual activity: Yes    Partners: Male  Other Topics Concern  . 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 Pneumococcal Polysaccharide-23 09/03/2015  . Tdap 08/03/2016  Review of Systems  Constitutional: Negative.   HENT: Negative.   Eyes: Negative.   Respiratory: Negative.   Cardiovascular: Negative.   Genitourinary: Negative.  Negative for dysuria and hematuria.  Musculoskeletal: Negative for back pain.  Skin: Negative.   Neurological: Negative.   Endo/Heme/Allergies: Negative for polydipsia.  Psychiatric/Behavioral: Negative.  Negative for depression, memory loss and suicidal ideas.   Objective:     Physical Exam  Constitutional: She is oriented to person, place, and time.  HENT:  Head: Normocephalic.  Right Ear: External ear normal.  Mouth/Throat: Oropharynx is clear and moist.  Eyes: Pupils are equal, round, and reactive to light.  Neck: Normal range of motion. Neck supple.  Cardiovascular: Normal rate, regular rhythm, normal heart sounds and intact distal pulses.  Pulmonary/Chest: Breath sounds normal.  Abdominal: Soft. Bowel sounds are normal.  Neurological: She is alert and oriented to person, place, and time. She has normal motor skills and normal strength. She displays no weakness. Gait normal. Gait normal.  Skin: Skin is warm and dry.  Psychiatric: Mood, memory, affect and judgment normal.   Assessment:   BP 140/82 (BP Location: Right Arm, Patient Position: Sitting, Cuff Size: Large) Comment: manually  Pulse 98   Temp 98.5 F (36.9 C) (Oral)  Resp 16   Ht 5\' 4"  (1.626 m)   Wt 212 lb (96.2 kg)   LMP 04/23/2017 Comment: negative urine pregnancy test 121718   BMI 36.39 kg/m   Plan:      Essential hypertension Blood pressure is above goal, will increase hydrochlorothiazide to 25 mg per day. Will review renal functioning as results become available.  We have discussed target BP range and blood pressure goal. I have advised patient to  check BP regularly and to call us back or report to clinic if the numbers are consistently higher than 140/90. We discussed the importance of compliance with medical therapy and DASH diet recommended, consequences of uncontrolled hypertension discussed.  - continue current BP medications - Microalbumin/Creatinine Ratio, Urine - POCT urinalysis dip (device)  Tobacco dependence Smoking cessation instruction/counseling given:  counseled patient on the dangers of tobacco use, advised patient to stop smoking, and reviewed strategies to maximize success     RTC: 3 months for hypertension    Nolon Nations  MSN, FNP-C [facility] [address] [place], [place] [address] [phone]

## 2017-04-27 LAB — MICROALBUMIN / CREATININE URINE RATIO
CREATININE, UR: 317.7 mg/dL
MICROALB/CREAT RATIO: 11 mg/g{creat} (ref 0.0–30.0)
MICROALBUM., U, RANDOM: 35.1 ug/mL

## 2017-05-28 ENCOUNTER — Ambulatory Visit: Payer: Medicaid Other | Admitting: Family Medicine

## 2017-06-13 ENCOUNTER — Ambulatory Visit (HOSPITAL_COMMUNITY)
Admission: EM | Admit: 2017-06-13 | Discharge: 2017-06-13 | Disposition: A | Discharge status: Home / Self Care | Payer: Medicaid Other | Attending: Family Medicine | Admitting: Family Medicine

## 2017-06-13 ENCOUNTER — Other Ambulatory Visit: Payer: Self-pay

## 2017-06-13 ENCOUNTER — Ambulatory Visit (INDEPENDENT_AMBULATORY_CARE_PROVIDER_SITE_OTHER): Payer: Medicaid Other

## 2017-06-13 ENCOUNTER — Encounter (HOSPITAL_COMMUNITY): Payer: Self-pay

## 2017-06-13 DIAGNOSIS — M25512 Pain in left shoulder: Secondary | ICD-10-CM

## 2017-06-13 MED ORDER — PREDNISONE 10 MG (21) PO TBPK: ORAL_TABLET | Freq: Every day | ORAL | 0 refills | Status: DC

## 2017-06-13 NOTE — ED Triage Notes (Signed)
[facility], pt states she fell on ice last snow storm about three weeks ago and shoulder is in pain

## 2017-06-18 NOTE — ED Provider Notes (Signed)
[facility]   [id number] 06/13/17 Arrival Time: 1743  ASSESSMENT & PLAN:  1. Acute pain of left shoulder     Imaging: Dg Shoulder Left  Result Date: 06/13/2017 CLINICAL DATA:  Fall down stairs. Left shoulder pain. Initial encounter. EXAM: LEFT SHOULDER - 2+ VIEW COMPARISON:  None. FINDINGS: There is no evidence of fracture or dislocation. There is no evidence of arthropathy or other focal bone abnormality. Left subclavian vascular stent noted. IMPRESSION: Negative. Electronically Signed   By: Ashley RosenthalJohn  Stahl M.D.   On: 06/13/2017 19:15   Reports that she cannot take NSAIDS. Tylenol not helping much.  Meds ordered this encounter  Medications  . predniSONE (STERAPRED UNI-PAK 21 TAB) 10 MG (21) TBPK tablet    Sig: Take by mouth daily. Take as directed.    Dispense:  21 tablet    Refill:  0   If she does not improve, she may benefit from seeing her PCP or an orthopaedist. She agrees. Will do her best to ensure ROM.  Reviewed expectations re: course of current medical issues. Questions answered. Outlined signs and symptoms indicating need for more acute intervention. Patient verbalized understanding. After Visit Summary given.  SUBJECTIVE: History from: patient. Ashley Morton is a 37 y.o. female who reports poorly localized mild pain of her left shoulder that is stable; intermittent; described as aching without radiation. Onset: gradual, 3 weeks ago. Injury/trama: yes, fell onto ground when she slipped on ice 3 weeks ago. Relieved by: "not moving it much". Worsened by: certain movements. Associated symptoms: none reported. Extremity sensation changes or weakness: none. Self treatment: Tylenol without much help; able to sleep through the night without much pain. History of similar: no  ROS: As per HPI.   OBJECTIVE:  Vitals:   06/13/17 1839  BP: (!) 143/96  Pulse: 87  Resp: 17  Temp: 98.7 F (37.1 C)  TempSrc: Oral  SpO2: 98%    General appearance: alert;  no distress Extremities: no cyanosis or edema; symmetrical with no gross deformities; poorly localized tenderness over her left shoulder area with no swelling and no bruising; ROM: limited by pain CV: normal extremity capillary refill Skin: warm and dry Neurologic: normal gait; normal symmetric reflexes in all extremities; normal sensation in all extremities Psychological: alert and cooperative; normal mood and affect  Allergies  Allergen Reactions  . Aspirin Other (See Comments)    States she cannot take the taste of the ASA even the chewable    Past Medical History:  Diagnosis Date  . Anemia   . GERD (gastroesophageal reflux disease)   . Helicobacter pylori gastritis 12/26/2016   Tx and eradicated  . Hypertension   . Peptic ulcer    Social History   Socioeconomic History  . Marital status: Single    Spouse name: Not on file  . Number of children: 3  . Years of education: Not on file  . Highest education level: Not on file  Social Needs  . Financial resource strain: Not on file  . Food insecurity - worry: Not on file  . Food insecurity - inability: Not on file  . Transportation needs - medical: Not on file  . Transportation needs - non-medical: Not on file  Occupational History  . 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 Smoking status: Current Every Day Smoker    Packs/day: 1.00    Types: Cigarettes  . Smokeless tobacco: Current User  Substance and Sexual Activity  .  Alcohol use: Yes    Alcohol/week: 0.0 oz    Comment: Once a week.   . Drug use: No  . Sexual activity: Yes    Partners: Male  Other Topics Concern  . Not on file  Social History Narrative   Single - 3 sons   [employer] utlility prep and dishwashing   Family History  Problem Relation Age of Onset  . Diabetes Maternal Grandmother   . Kidney disease Maternal Grandmother   . Heart disease Maternal Grandmother    Past Surgical History:  Procedure  Laterality Date  . SHOULDER SURGERY        Ashley Layman, MD 06/18/17 [id number]

## 2017-07-19 ENCOUNTER — Encounter: Payer: Self-pay | Admitting: Family Medicine

## 2017-07-19 ENCOUNTER — Ambulatory Visit (INDEPENDENT_AMBULATORY_CARE_PROVIDER_SITE_OTHER): Payer: Medicaid Other | Admitting: Family Medicine

## 2017-07-19 VITALS — BP 150/82 | HR 81 | Temp 98.4°F | Ht 64.0 in | Wt 216.0 lb

## 2017-07-19 DIAGNOSIS — M25512 Pain in left shoulder: Secondary | ICD-10-CM | POA: Diagnosis not present

## 2017-07-19 LAB — POCT URINALYSIS DIP (DEVICE)
Bilirubin Urine: NEGATIVE
GLUCOSE, UA: NEGATIVE mg/dL
Ketones, ur: NEGATIVE mg/dL
Leukocytes, UA: NEGATIVE
NITRITE: NEGATIVE
PROTEIN: 30 mg/dL — AB
Specific Gravity, Urine: >=1.03 (ref 1.005–1.030)
UROBILINOGEN UA: 1 mg/dL (ref 0.0–1.0)
pH: 5.5 (ref 5.0–8.0)

## 2017-07-19 MED ORDER — KETOROLAC TROMETHAMINE 60 MG/2ML IM SOLN
60.0000 mg | Freq: Once | INTRAMUSCULAR | Status: AC
  Administered 2017-07-19: 60 mg via INTRAMUSCULAR

## 2017-07-19 MED ORDER — GABAPENTIN 300 MG PO CAPS
300.0000 mg | ORAL_CAPSULE | Freq: Two times a day (BID) | ORAL | 0 refills | Status: DC
Start: 2017-07-19 — End: 2017-08-01

## 2017-07-19 MED ORDER — PREDNISONE 20 MG PO TABS: 20.0000 mg | ORAL_TABLET | Freq: Every day | ORAL | 0 refills | Status: DC

## 2017-07-19 NOTE — Patient Instructions (Signed)
I suspect that you have a left shoulder impingement related to a fall.  Will start a trial of gabapentin  300 mg twice daily.  We will also start a trial of prednisone 20 mg daily for 5 days.  Refrain from lifting anything heavier than 20 pounds.  Recommend applying warm moist compresses for 20 minutes 4 times daily, use interchangeably with cool compresses.  We will follow-up in 2 weeks.  At that time we will discuss a possible referral to orthopedic specialist.   Shoulder Impingement Syndrome Shoulder impingement syndrome is a condition that causes pain when connective tissues (tendons) surrounding the shoulder joint become pinched. These tendons are part of the group of muscles and tissues that help to stabilize the shoulder (rotator cuff). Beneath the rotator cuff is a fluid-filled sac (bursa) that allows the muscles and tendons to glide smoothly. The bursa may become swollen or irritated (bursitis). Bursitis, swelling in the rotator cuff tendons, or both conditions can decrease how much space is under a bone in the shoulder joint (acromion), resulting in impingement. What are the causes? Shoulder impingement syndrome can be caused by bursitis or swelling of the rotator cuff tendons, which may result from:  Repetitive overhead arm movements.  Falling onto the shoulder.  Weakness in the shoulder muscles.  What increases the risk? You may be more likely to develop this condition if you are an athlete who participates in:  Sports that involve throwing, such as baseball.  Tennis.  Swimming.  Volleyball.  Some people are also more likely to develop impingement syndrome because of the shape of their acromion bone. What are the signs or symptoms? The main symptom of this condition is pain on the front or side of the shoulder. Pain may:  Get worse when lifting or raising the arm.  Get worse at night.  Wake you up from sleeping.  Feel sharp when the shoulder is moved, and then fade to  an ache.  Other signs and symptoms may include:  Tenderness.  Stiffness.  Inability to raise the arm above shoulder level or behind the body.  Weakness.  How is this diagnosed? This condition may be diagnosed based on:  Your symptoms.  Your medical history.  A physical exam.  Imaging tests, such as: ? X-rays. ? MRI. ? Ultrasound.  How is this treated? Treatment for this condition may include:  Resting your shoulder and avoiding all activities that cause pain or put stress on the shoulder.  Icing your shoulder.  NSAIDs to help reduce pain and swelling.  One or more injections of medicines to numb the area and reduce inflammation.  Physical therapy.  Surgery. This may be needed if nonsurgical treatments have not helped. Surgery may involve repairing the rotator cuff, reshaping the acromion, or removing the bursa.  Follow these instructions at home: Managing pain, stiffness, and swelling  If directed, apply ice to the injured area. ? Put ice in a plastic bag. ? Place a towel between your skin and the bag. ? Leave the ice on for 20 minutes, 2-3 times a day. Activity  Rest and return to your normal activities as told by your health care provider. Ask your health care provider what activities are safe for you.  Do exercises as told by your health care provider. General instructions  Do not use any tobacco products, including cigarettes, chewing tobacco, or e-cigarettes. Tobacco can delay healing. If you need help quitting, ask your health care provider.  Ask your health care provider when  it is safe for you to drive.  Take over-the-counter and prescription medicines only as told by your health care provider.  Keep all follow-up visits as told by your health care provider. This is important. How is this prevented?  Give your body time to rest between periods of activity.  Be safe and responsible while being active to avoid falls.  Maintain physical  fitness, including strength and flexibility. Contact a health care provider if:  Your symptoms have not improved after 1-2 months of treatment and rest.  You cannot lift your arm away from your body. This information is not intended to replace advice given to you by your health care provider. Make sure you discuss any questions you have with your health care provider. Document Released: 04/10/2005 Document Revised: 12/16/2015 Document Reviewed: 03/13/2015 Elsevier Interactive Patient Education  [employer].

## 2017-07-19 NOTE — Progress Notes (Signed)
Subjective:    Ashley Morton is a 37 y.o. female who presents with left shoulder pain.  Symptoms began on 06/13/2017 after slipping and falling down icy stairs.  Patient states that she landed on the left shoulder.  She was evaluated in urgent care.  An x-ray of the left shoulder was obtained, no acute abnormalities noted.  There was no evidence of fracture or dislocation.  Patient has been taking Tylenol 3 and oxycodone 10 mg without sustained relief.  Pain is aggravated by lifting heavy objects, reaching overhead and repetitive motions. Pain is located around the acromioclavicular [facility]) joint. Discomfort is described as shooting.  Past Medical History:  Diagnosis Date  . Anemia   . GERD (gastroesophageal reflux disease)   . Helicobacter pylori gastritis 12/26/2016   Tx and eradicated  . Hypertension   . Peptic ulcer    Social History   Socioeconomic History  . Marital status: Single    Spouse name: Not on file  . Number of children: 3  . Years of education: Not on file  . Highest education level: Not on file  Occupational History  . Occupation: Research scientist (physical sciences): [employer]  Social Needs  . Financial resource strain: Not on file  . Food insecurity:    Worry: Not on file    Inability: Not on file  . Transportation needs:    Medical: Not on file    Non-medical: Not on file  Tobacco Use  . Smoking status: Current Every Day Smoker    Packs/day: 1.00    Types: Cigarettes  . Smokeless tobacco: Current User  Substance and Sexual Activity  . Alcohol use: Yes    Alcohol/week: 0.0 oz    Comment: Once a week.   . Drug use: No  . Sexual activity: Yes    Partners: Male  Lifestyle  . Physical activity:    Days per week: Not on file    Minutes per session: Not on file  . Stress: Not on file  Relationships  . Social connections:    Talks on phone: Not on file    Gets together: Not on file    Attends religious service: Not on file    Active member of club  or organization: Not on file    Attends meetings of clubs or organizations: Not on file    Relationship status: Not on file  . Intimate partner violence:    Fear of current or ex partner: Not on file    Emotionally abused: Not on file    Physically abused: Not on file    Forced sexual activity: Not on file  Other Topics Concern  . 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 Pneumococcal Polysaccharide-23 09/03/2015  . Tdap 08/03/2016   Review of Systems  Constitutional: Negative.   HENT: Negative.   Eyes: Negative.   Respiratory: Negative.   Cardiovascular: Negative.   Gastrointestinal: Negative.   Genitourinary: Negative.   Musculoskeletal: Positive for joint pain (left shoulder).  Skin: Negative.   Endo/Heme/Allergies: Negative.   Psychiatric/Behavioral: Negative.     Objective:  Physical Exam  Cardiovascular: Normal rate, regular rhythm, normal heart sounds and intact distal pulses.  Pulmonary/Chest: Effort normal and breath sounds normal.  Abdominal: Soft. Bowel sounds are normal.  Musculoskeletal:       Left shoulder: She exhibits decreased  range of motion (Unable to raise greater than 90 degrees), tenderness, pain and decreased strength (3/5). She exhibits no swelling and no spasm.  Skin: Skin is warm and dry.     Assessment:  BP (!) 150/82 (BP Location: Right Arm, Patient Position: Sitting, Cuff Size: Large)   Pulse 81   Temp 98.4 F (36.9 C) (Oral)   Ht 5\' 4"  (1.626 m)   Wt 216 lb (98 kg)   LMP 07/19/2017   BMI 37.08 kg/m   Plan:  Left shoulder pain, unspecified chronicity - ketorolac (TORADOL) injection 60 mg - predniSONE (DELTASONE) 20 MG tablet; Take 1 tablet (20 mg total) by mouth daily with breakfast.  Dispense: 7 tablet; Refill: 0 - gabapentin (NEURONTIN) 300 MG capsule; Take 1 capsule (300 mg total) by mouth 2 (two)  times daily.  Dispense: 60 capsule; Refill: 0  Reduction in offending activity. Gentle ROM exercises. Rest, ice, compression, and elevation (RICE) therapy. NSAIDs per medication orders.    RTC: Will follow up in 2 weeks for left shoulder pain  Nolon NationsLachina Moore Benyamin Jeff  MSN, FNP-C Patient [facility] [address]  [place], [place] [address] [phone]

## 2017-07-30 ENCOUNTER — Encounter: Payer: Self-pay | Admitting: Family Medicine

## 2017-08-01 ENCOUNTER — Ambulatory Visit (INDEPENDENT_AMBULATORY_CARE_PROVIDER_SITE_OTHER): Payer: Medicaid Other | Admitting: Family Medicine

## 2017-08-01 VITALS — BP 138/78 | HR 112 | Temp 98.5°F | Resp 16 | Ht 64.0 in | Wt 216.0 lb

## 2017-08-01 DIAGNOSIS — M25512 Pain in left shoulder: Secondary | ICD-10-CM

## 2017-08-01 DIAGNOSIS — G8929 Other chronic pain: Secondary | ICD-10-CM | POA: Diagnosis not present

## 2017-08-01 DIAGNOSIS — F17200 Nicotine dependence, unspecified, uncomplicated: Secondary | ICD-10-CM

## 2017-08-01 MED ORDER — AMITRIPTYLINE HCL 25 MG PO TABS: 25.0000 mg | ORAL_TABLET | Freq: Every day | ORAL | 0 refills | Status: AC

## 2017-08-01 MED ORDER — ACETAMINOPHEN-CODEINE #3 300-30 MG PO TABS: 1.0000 | ORAL_TABLET | Freq: Four times a day (QID) | ORAL | 0 refills | Status: AC | PRN

## 2017-08-01 MED ORDER — KETOROLAC TROMETHAMINE 60 MG/2ML IM SOLN
60.0000 mg | Freq: Once | INTRAMUSCULAR | Status: AC
  Administered 2017-08-01: 60 mg via INTRAMUSCULAR

## 2017-08-01 NOTE — Progress Notes (Signed)
Subjective:    Ashley Morton is a 37 y.o. female who presents with left shoulder pain.  Symptoms began on 06/13/2017 after slipping and falling down icy stairs.  Patient states that she attempted to stop fall with left shoulder.  She was evaluated in urgent care and the emergency department for this problem.  An x-ray of the left shoulder was obtained, no acute abnormalities noted.  There was no evidence of fracture or dislocation.   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  Pain is located around the acromioclavicular [facility]) joint. Discomfort is described as shooting. Current pain intensity is 10/10. Patient was previously prescribed gabapentin and NSAIDs without relief.  Past Medical History:  Diagnosis Date  . Anemia   . GERD (gastroesophageal reflux disease)   . Helicobacter pylori gastritis 12/26/2016   Tx and eradicated  . Hypertension   . Peptic ulcer    Social History   Socioeconomic History  . Marital status: Single    Spouse name: Not on file  . Number of children: 3  . Years of education: Not on file  . Highest education level: Not on file  Occupational History  . Occupation: Research scientist (physical sciences): [employer]  Social Needs  . Financial resource strain: Not on file  . Food insecurity:    Worry: Not on file    Inability: Not on file  . Transportation needs:    Medical: Not on file    Non-medical: Not on file  Tobacco Use  . Smoking status: Current Every Day Smoker    Packs/day: 1.00    Types: Cigarettes  . Smokeless tobacco: Current User  Substance and Sexual Activity  . Alcohol use: Yes    Alcohol/week: 0.0 oz    Comment: Once a week.   . Drug use: No  . Sexual activity: Yes    Partners: Male  Lifestyle  . Physical activity:    Days per week: Not on file    Minutes per session:  Not on file  . Stress: Not on file  Relationships  . Social connections:    Talks on phone: Not on file    Gets together: Not on file    Attends religious service: Not on file    Active member of club or organization: Not on file    Attends meetings of clubs or organizations: Not on file    Relationship status: Not on file  . Intimate partner violence:    Fear of current or ex partner: Not on file    Emotionally abused: Not on file    Physically abused: Not on file    Forced sexual activity: Not on file  Other Topics Concern  . 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 Pneumococcal Polysaccharide-23 09/03/2015  . Tdap 08/03/2016   Review of Systems  Constitutional: Negative.   HENT: Negative.   Eyes: Negative.   Respiratory: Negative.   Cardiovascular: Negative.   Gastrointestinal: Negative.   Genitourinary: Negative.   Musculoskeletal: Positive for joint pain (left shoulder).  Skin: Negative.   Endo/Heme/Allergies: Negative.   Psychiatric/Behavioral: Negative.     Objective:  Physical Exam  Cardiovascular: Normal rate, regular rhythm, normal heart  sounds and intact distal pulses.  Pulmonary/Chest: Effort normal and breath sounds normal.  Abdominal: Soft. Bowel sounds are normal.  Musculoskeletal:       Right shoulder: She exhibits decreased range of motion, tenderness, pain and decreased strength (3/4 strength).       Left shoulder: She exhibits decreased range of motion (Unable to raise greater than 90 degrees), tenderness, pain and decreased strength (3/5). She exhibits no swelling and no spasm.  Skin: Skin is warm and dry.     Assessment:  BP 138/78 (BP Location: Right Arm, Patient Position: Sitting, Cuff Size: Large) Comment: manually  Pulse (!) 112   Temp 98.5 F (36.9 C) (Oral)   Resp 16   Ht 5\' 4"  (1.626 m)   Wt 216 lb (98  kg)   LMP 07/19/2017   SpO2 100%   BMI 37.08 kg/m   Plan:  Left shoulder pain, unspecified chronicity Reviewed [place] Substance Reporting system prior to prescribing opiate medications. No inconsistencies noted.   - acetaminophen-codeine (TYLENOL #3) 300-30 MG tablet; Take 1 tablet by mouth every 6 (six) hours as needed for up to 3 days for moderate pain.  Dispense: 15 tablet; Refill: 0 - amitriptyline (ELAVIL) 25 MG tablet; Take 1 tablet (25 mg total) by mouth at bedtime.  Dispense: 15 tablet; Refill: 0 - AMB referral to orthopedics - ketorolac (TORADOL) injection 60 mg   Reduction in offending activity. Gentle ROM exercises. Rest, ice, compression, and elevation (RICE) therapy. NSAIDs per medication orders.    Tobacco dependence Smoking cessation instruction/counseling given:  counseled patient on the dangers of tobacco use, advised patient to stop smoking, and reviewed strategies to maximize success  RTC: Follow up as previously scheduled for chronic conditions  Nolon NationsLachina Moore Axiel Fjeld  MSN, FNP-C Patient Care [facility] [address]  [place], [place] [address] [phone]

## 2017-08-01 NOTE — Patient Instructions (Signed)
You have worsening chronic left shoulder pain, I have sent an urgent referral to orthopedic services. We will also start a trial of Tylenol 3 every 6 hours for moderate to severe left shoulder pain. For insomnia will start amitriptyline 25 mg at bedtime.  Recommend that he refrain from mixing medications.  Also refrain from drinking alcohol, driving, or operating machinery while taking these medications.  Refrain from lifting any items that are greater than 10 pounds with left extremity. Apply warm moist compresses at rest as needed.

## 2017-08-03 ENCOUNTER — Ambulatory Visit: Payer: Self-pay | Admitting: Family Medicine

## 2017-08-05 ENCOUNTER — Encounter: Payer: Self-pay | Admitting: Family Medicine

## 2017-08-06 ENCOUNTER — Encounter (INDEPENDENT_AMBULATORY_CARE_PROVIDER_SITE_OTHER): Payer: Self-pay | Admitting: Orthopaedic Surgery

## 2017-08-06 ENCOUNTER — Ambulatory Visit (INDEPENDENT_AMBULATORY_CARE_PROVIDER_SITE_OTHER): Payer: Medicaid Other | Admitting: Orthopaedic Surgery

## 2017-08-06 DIAGNOSIS — M25512 Pain in left shoulder: Secondary | ICD-10-CM | POA: Diagnosis not present

## 2017-08-06 DIAGNOSIS — G8929 Other chronic pain: Secondary | ICD-10-CM | POA: Diagnosis not present

## 2017-08-06 MED ORDER — MELOXICAM 7.5 MG PO TABS: 7.5000 mg | ORAL_TABLET | Freq: Every day | ORAL | 1 refills | Status: AC | PRN
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
## 2017-08-07 ENCOUNTER — Telehealth (INDEPENDENT_AMBULATORY_CARE_PROVIDER_SITE_OTHER): Payer: Self-pay | Admitting: Orthopaedic Surgery

## 2017-08-07 NOTE — Telephone Encounter (Signed)
Pt called in a lot of pain and stated she cant sched her appt, with [facility] due to stint in her arm. [facility] will need a fax from Dr.Xu to sched appt. I did inform pt that she will probably need to get that information from the hospital in [place]. Ms.Ashley Morton would like to know if she can have an  injection for now if she cant get MRI. Pt is having a extreme amount pain.

## 2017-08-08 ENCOUNTER — Ambulatory Visit: Payer: Self-pay | Admitting: Family Medicine

## 2017-08-08 ENCOUNTER — Telehealth (INDEPENDENT_AMBULATORY_CARE_PROVIDER_SITE_OTHER): Payer: Self-pay

## 2017-08-08 NOTE — Telephone Encounter (Signed)
Please advise 

## 2017-08-08 NOTE — Telephone Encounter (Signed)
FYI-  Patient called stating that she can not schedule an appt.for her MRI until [facility] gets a note from Dr. Roda ShuttersXu or we receive her medical records [facility] concerning what type of stent patient has in her shoulder.  Advised patient she needed to call [facility] to get her medical records faxed to our office.  Provided patient with phone number to Medical Center.

## 2017-08-08 NOTE — Telephone Encounter (Signed)
I don't think a stent is a contraindication to an MRI.  She's always welcome to come in for an injection if we feel it's indicated.

## 2017-08-09 NOTE — Telephone Encounter (Signed)
Ashley Morton took the call yesterday about this pt, but I believe it was because she has a stent in her shoulder and imaging place did not say they could not do it they just need a letter stating that it is ok to do MRI and that the stent would not affect it. Ashley Morton spoke to her.

## 2017-08-09 NOTE — Telephone Encounter (Signed)
Yes I would like to get MRI if possible.  Yes it is ok for injection next week.

## 2017-08-09 NOTE — Telephone Encounter (Signed)
yes

## 2017-08-09 NOTE — Telephone Encounter (Signed)
Okay to do letter Dr Roda ShuttersXu?  Thank you Sabrina.

## 2017-08-09 NOTE — Telephone Encounter (Signed)
Would you like to know if she can still do MRI some where else? She wants to know why she cannot have MRI  Okay for her to get Cortisone inj with Mardella LaymanLindsey next week?     Ashley Morton: Do you know why imaging place does not want to?

## 2017-08-13 ENCOUNTER — Encounter (INDEPENDENT_AMBULATORY_CARE_PROVIDER_SITE_OTHER): Payer: Self-pay | Admitting: Physician Assistant

## 2017-08-13 ENCOUNTER — Encounter (INDEPENDENT_AMBULATORY_CARE_PROVIDER_SITE_OTHER): Payer: Self-pay | Admitting: Orthopaedic Surgery

## 2017-08-13 ENCOUNTER — Ambulatory Visit (INDEPENDENT_AMBULATORY_CARE_PROVIDER_SITE_OTHER): Payer: Medicaid Other | Admitting: Physician Assistant

## 2017-08-13 ENCOUNTER — Encounter (INDEPENDENT_AMBULATORY_CARE_PROVIDER_SITE_OTHER): Payer: Self-pay

## 2017-08-13 DIAGNOSIS — M25512 Pain in left shoulder: Secondary | ICD-10-CM | POA: Diagnosis not present

## 2017-08-13 DIAGNOSIS — G8929 Other chronic pain: Secondary | ICD-10-CM | POA: Diagnosis not present

## 2017-08-13 MED ORDER — BUPIVACAINE HCL 0.25 % IJ SOLN
2.0000 mL | INTRAMUSCULAR | Status: AC | PRN
  Administered 2017-08-13: 2 mL via INTRA_ARTICULAR

## 2017-08-13 MED ORDER — LIDOCAINE HCL 2 % IJ SOLN
2.0000 mL | INTRAMUSCULAR | Status: AC | PRN
  Administered 2017-08-13: 2 mL

## 2017-08-13 MED ORDER — METHYLPREDNISOLONE ACETATE 40 MG/ML IJ SUSP
40.0000 mg | INTRAMUSCULAR | Status: AC | PRN
  Administered 2017-08-13: 40 mg via INTRA_ARTICULAR
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
## 2017-08-13 NOTE — Telephone Encounter (Signed)
Letter made.

## 2017-08-14 ENCOUNTER — Encounter (INDEPENDENT_AMBULATORY_CARE_PROVIDER_SITE_OTHER): Payer: Self-pay | Admitting: Orthopaedic Surgery

## 2017-08-14 ENCOUNTER — Telehealth (INDEPENDENT_AMBULATORY_CARE_PROVIDER_SITE_OTHER): Payer: Self-pay | Admitting: *Deleted

## 2017-08-14 NOTE — Telephone Encounter (Signed)
I am happy to write a rx for tramadol, but there is nothing more to do than injection and wait for MRI.  I'm sorry she is hurting so bad.  We are doing everything we can

## 2017-08-14 NOTE — Telephone Encounter (Signed)
I called [facility] and sw Pam and I advised her that Dr. Roda ShuttersXu had written a note stating it is ok for pt to have her MRI and that it should not affect it. Pam stated the note will not work they will need the details of the stent with make/model and where exactly the stent was placed. I have told pt previously that she can call [facility] to request her records or can come to our office to fill out a records release form so we can get the records. Pt states she has tried calling the facility but no one will answer. I am going to call them myself and see if can fax records over to us.

## 2017-08-15 NOTE — Telephone Encounter (Signed)
Pt came to office to sign Records Release from [facility] to [facility]. I faxed over the release this am 08/15/17. Pending records

## 2017-08-16 NOTE — Telephone Encounter (Signed)
Patient called to see if we had received records, Tammy confirmed we had them and that you were working on referral?

## 2017-08-17 NOTE — Telephone Encounter (Signed)
I have received only the operative report on this pt but did not receive any information about the stent itself, I need the model/make # and the name of the company who made it. I faxed information back to [facility] advising the above.   I called pt back and advised her as well what the hold up is and that I should receive that information today.

## 2017-08-21 ENCOUNTER — Encounter (INDEPENDENT_AMBULATORY_CARE_PROVIDER_SITE_OTHER): Payer: Self-pay | Admitting: Orthopaedic Surgery

## 2017-08-21 DIAGNOSIS — G8929 Other chronic pain: Secondary | ICD-10-CM | POA: Insufficient documentation

## 2017-08-21 DIAGNOSIS — I10 Essential (primary) hypertension: Secondary | ICD-10-CM | POA: Diagnosis not present

## 2017-08-21 DIAGNOSIS — Z79899 Other long term (current) drug therapy: Secondary | ICD-10-CM | POA: Insufficient documentation

## 2017-08-21 DIAGNOSIS — F17210 Nicotine dependence, cigarettes, uncomplicated: Secondary | ICD-10-CM | POA: Diagnosis not present

## 2017-08-21 DIAGNOSIS — M25512 Pain in left shoulder: Secondary | ICD-10-CM | POA: Diagnosis present

## 2017-08-22 ENCOUNTER — Other Ambulatory Visit: Payer: Self-pay

## 2017-08-22 ENCOUNTER — Encounter (INDEPENDENT_AMBULATORY_CARE_PROVIDER_SITE_OTHER): Payer: Self-pay | Admitting: Orthopaedic Surgery

## 2017-08-22 ENCOUNTER — Emergency Department (HOSPITAL_COMMUNITY)
Admission: EM | Admit: 2017-08-22 | Discharge: 2017-08-22 | Disposition: A | Discharge status: Home / Self Care | Payer: Medicaid Other | Attending: Emergency Medicine | Admitting: Emergency Medicine

## 2017-08-22 ENCOUNTER — Encounter (HOSPITAL_COMMUNITY): Payer: Self-pay

## 2017-08-22 DIAGNOSIS — M25512 Pain in left shoulder: Secondary | ICD-10-CM

## 2017-08-22 DIAGNOSIS — G8929 Other chronic pain: Secondary | ICD-10-CM

## 2017-08-22 MED ORDER — HYDROMORPHONE HCL 1 MG/ML IJ SOLN
1.0000 mg | Freq: Once | INTRAMUSCULAR | Status: AC
  Administered 2017-08-22: 1 mg via INTRAMUSCULAR
  Filled 2017-08-22: qty 1

## 2017-08-22 MED ORDER — KETOROLAC TROMETHAMINE 30 MG/ML IJ SOLN
30.0000 mg | Freq: Once | INTRAMUSCULAR | Status: AC
  Administered 2017-08-22: 30 mg via INTRAMUSCULAR
  Filled 2017-08-22: qty 1

## 2017-08-22 NOTE — ED Notes (Signed)
Patient states that pain is worse in the left shoulder than it used to be. Patient states that she used to sleep but for about a month has been unable to sleep.
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
## 2017-08-23 NOTE — Telephone Encounter (Signed)
I called over to [facility] medical records dept left vm advising them I am needing additional information about the stent as I only received the operative report which tells me nothing about the stent. Pending call back or fax with the information.  I also called pt informing her that I am waiting on the above information, she states she is going to also call over there to see if they can send over records.

## 2017-08-24 ENCOUNTER — Encounter (INDEPENDENT_AMBULATORY_CARE_PROVIDER_SITE_OTHER): Payer: Self-pay | Admitting: Orthopaedic Surgery

## 2017-08-24 ENCOUNTER — Other Ambulatory Visit (INDEPENDENT_AMBULATORY_CARE_PROVIDER_SITE_OTHER): Payer: Self-pay | Admitting: Orthopaedic Surgery

## 2017-08-24 DIAGNOSIS — G8929 Other chronic pain: Secondary | ICD-10-CM

## 2017-08-24 DIAGNOSIS — M25512 Pain in left shoulder: Principal | ICD-10-CM

## 2017-08-24 NOTE — Telephone Encounter (Signed)
Assuming this is an insurance issue as to the delay?

## 2017-08-25 ENCOUNTER — Encounter (INDEPENDENT_AMBULATORY_CARE_PROVIDER_SITE_OTHER): Payer: Self-pay | Admitting: Orthopaedic Surgery

## 2017-08-27 ENCOUNTER — Ambulatory Visit
Admission: RE | Admit: 2017-08-27 | Discharge: 2017-08-27 | Disposition: A | Discharge status: Home / Self Care | Payer: Medicaid Other | Source: Ambulatory Visit | Attending: Orthopaedic Surgery | Admitting: Orthopaedic Surgery

## 2017-08-27 DIAGNOSIS — G8929 Other chronic pain: Secondary | ICD-10-CM

## 2017-08-27 DIAGNOSIS — M25512 Pain in left shoulder: Principal | ICD-10-CM

## 2017-08-27 NOTE — Telephone Encounter (Signed)
appt scheduled today 

## 2017-08-28 ENCOUNTER — Ambulatory Visit (INDEPENDENT_AMBULATORY_CARE_PROVIDER_SITE_OTHER): Payer: Medicaid Other | Admitting: Orthopaedic Surgery

## 2017-08-28 ENCOUNTER — Ambulatory Visit (INDEPENDENT_AMBULATORY_CARE_PROVIDER_SITE_OTHER): Payer: Medicaid Other

## 2017-08-28 ENCOUNTER — Encounter (INDEPENDENT_AMBULATORY_CARE_PROVIDER_SITE_OTHER): Payer: Self-pay | Admitting: Orthopaedic Surgery

## 2017-08-28 DIAGNOSIS — M25512 Pain in left shoulder: Secondary | ICD-10-CM

## 2017-08-28 DIAGNOSIS — G8929 Other chronic pain: Secondary | ICD-10-CM

## 2017-08-28 MED ORDER — BUPIVACAINE HCL 0.5 % IJ SOLN
3.0000 mL | INTRAMUSCULAR | Status: AC | PRN
  Administered 2017-08-28: 3 mL via INTRA_ARTICULAR

## 2017-08-28 MED ORDER — TRIAMCINOLONE ACETONIDE 40 MG/ML IJ SUSP
80.0000 mg | INTRAMUSCULAR | Status: AC | PRN
  Administered 2017-08-28: 80 mg via INTRA_ARTICULAR

## 2017-08-28 NOTE — Progress Notes (Signed)
Office Visit Note   Patient: Ashley Morton           Date of Birth: 09-Aug-1980           MRN: [id number] Visit Date: 08/28/2017              Requested by: Massie Maroon, FNP [address] [place], [place] [address] PCP: Massie Maroon, FNP   Assessment & Plan: Visit Diagnoses:  1. Chronic left shoulder pain     Plan: Subacromial injection did not help the pain.  MRI findings shows rotator cuff tendinosis without any full-thickness tears.  She does have some mild glenohumeral osteoarthritis with a loose body in the axillary pouch.  Her x-rays of her cervical spine show some mild degenerative disc disease at C4-5 with anterior spurring.  At this point patient continues to be in significant pain.  We will try an intra-articular steroid injection in her left shoulder to see if this gives her any relief.  If not we will need to obtain a cervical spine MRI.  Question encouraged and answered.  Immediately after the shoulder injection patient did have improved range of motion but she stormed out of her office for no apparent reason.  Total face to face encounter time was greater than 25 minutes and over half of this time was spent in counseling and/or coordination of care.  Follow-Up Instructions: Return in about 2 weeks (around 09/11/2017).   Orders:  Orders Placed This Encounter  Procedures  . Large Joint Inj: L glenohumeral  . XR Cervical Spine 2 or 3 views  . XR C-ARM NO REPORT   No orders of the defined types were placed in this encounter.     Procedures: Large Joint Inj: L glenohumeral on 08/28/2017 9:04 AM Indications: pain and diagnostic evaluation Details: 22 G 3.5 in needle, anteromedial approach  Arthrogram: Yes  Medications: 3 mL bupivacaine 0.5 %; 80 mg triamcinolone acetonide 40 MG/ML  Arthrogram demonstrated good flow of contrast throughout the joint surface without extravasation or obvious defect.  The patient was tearfull after injection and said  injection had not helped. She did demonstrate increased range of motion in abduction. She tearfully asked how "to get out of this place" and left abruptly. Procedure, treatment alternatives, risks and benefits explained, specific risks discussed. Consent was given by the patient. Immediately prior to procedure a time out was called to verify the correct patient, procedure, equipment, support staff and site/side marked as required. Patient was prepped and draped in the usual sterile fashion.       Clinical Data: No additional findings.   Subjective: Chief Complaint  Patient presents with  . Left Shoulder - Follow-up    S/p MRI    Patient comes in today for MRI review of her left shoulder.  She continues to have chronic left shoulder pain at rest and this radiates down into her hand more on the ulnar side of her hand.   Review of Systems  Constitutional: Negative.   HENT: Negative.   Eyes: Negative.   Respiratory: Negative.   Cardiovascular: Negative.   Endocrine: Negative.   Musculoskeletal: Negative.   Neurological: Negative.   Hematological: Negative.   Psychiatric/Behavioral: Negative.   All other systems reviewed and are negative.    Objective: Vital Signs: LMP 08/19/2017   Physical Exam  Constitutional: She is oriented to person, place, and time. She appears well-developed and well-nourished.  Pulmonary/Chest: Effort normal.  Neurological: She is alert and oriented  to person, place, and time.  Skin: Skin is warm. Capillary refill takes less than 2 seconds.  Psychiatric: She has a normal mood and affect. Her behavior is normal. Judgment and thought content normal.  Nursing note and vitals reviewed.   Ortho Exam Left shoulder exam is somewhat limited by significant guarding and withdrawing and lack of participation.  Negative Spurling sign. Specialty Comments:  No specialty comments available.  Imaging: Xr C-arm No Report  Result Date: 08/28/2017 Please see  Notes or Procedures tab for imaging impression.  Xr Cervical Spine 2 Or 3 Views  Result Date: 08/28/2017 Anterior vertebral osteophyte at C4-5.  No significant degenerative disc disease or cervical spondylosis.    PMFS History: Patient Active Problem List   Diagnosis Date Noted  . Chronic left shoulder pain 08/06/2017  . Helicobacter pylori gastritis 12/26/2016  . Essential hypertension 08/03/2016  . History of high blood pressure 09/03/2015  . Prediabetes 09/03/2015  . Menorrhagia with irregular cycle 09/03/2015  . Tobacco dependence 09/03/2015  . Obesity 11/06/2014  . Abnormal uterine bleeding (AUB) 11/06/2014   Past Medical History:  Diagnosis Date  . Anemia   . GERD (gastroesophageal reflux disease)   . Helicobacter pylori gastritis 12/26/2016   Tx and eradicated  . Hypertension   . Peptic ulcer     Family History  Problem Relation Age of Onset  . Diabetes Maternal Grandmother   . Kidney disease Maternal Grandmother   . Heart disease Maternal Grandmother     Past Surgical History:  Procedure Laterality Date  . SHOULDER SURGERY     Social History   Occupational History  . Occupation: Research scientist (physical sciences): [employer]  Tobacco Use  . Smoking status: Current Every Day Smoker    Packs/day: 1.00    Types: Cigarettes  . Smokeless tobacco: Current User  Substance and Sexual Activity  . Alcohol use: Yes    Alcohol/week: 0.0 oz    Comment: Once a week.   . Drug use: No  . Sexual activity: Yes    Partners: Male

## 2017-08-29 ENCOUNTER — Encounter (INDEPENDENT_AMBULATORY_CARE_PROVIDER_SITE_OTHER): Payer: Self-pay | Admitting: Orthopaedic Surgery

## 2017-08-30 ENCOUNTER — Encounter (INDEPENDENT_AMBULATORY_CARE_PROVIDER_SITE_OTHER): Payer: Self-pay | Admitting: Orthopaedic Surgery

## 2017-09-05 ENCOUNTER — Encounter (INDEPENDENT_AMBULATORY_CARE_PROVIDER_SITE_OTHER): Payer: Self-pay | Admitting: Orthopaedic Surgery

## 2017-09-05 ENCOUNTER — Other Ambulatory Visit (INDEPENDENT_AMBULATORY_CARE_PROVIDER_SITE_OTHER): Payer: Self-pay

## 2017-09-05 DIAGNOSIS — M542 Cervicalgia: Secondary | ICD-10-CM

## 2017-09-05 NOTE — Telephone Encounter (Signed)
Please schedule c spine MRI

## 2017-09-06 ENCOUNTER — Encounter (INDEPENDENT_AMBULATORY_CARE_PROVIDER_SITE_OTHER): Payer: Self-pay | Admitting: Orthopaedic Surgery

## 2017-09-08 ENCOUNTER — Ambulatory Visit
Admission: RE | Admit: 2017-09-08 | Discharge: 2017-09-08 | Disposition: A | Discharge status: Home / Self Care | Payer: Medicaid Other | Source: Ambulatory Visit | Attending: Orthopaedic Surgery | Admitting: Orthopaedic Surgery

## 2017-09-08 DIAGNOSIS — M542 Cervicalgia: Secondary | ICD-10-CM

## 2017-09-09 ENCOUNTER — Encounter (INDEPENDENT_AMBULATORY_CARE_PROVIDER_SITE_OTHER): Payer: Self-pay | Admitting: Orthopaedic Surgery

## 2017-09-13 ENCOUNTER — Encounter (INDEPENDENT_AMBULATORY_CARE_PROVIDER_SITE_OTHER): Payer: Self-pay | Admitting: Orthopaedic Surgery

## 2017-09-13 ENCOUNTER — Ambulatory Visit (INDEPENDENT_AMBULATORY_CARE_PROVIDER_SITE_OTHER): Payer: Medicaid Other | Admitting: Orthopaedic Surgery

## 2017-09-13 DIAGNOSIS — M25512 Pain in left shoulder: Secondary | ICD-10-CM | POA: Diagnosis not present

## 2017-09-13 DIAGNOSIS — G8929 Other chronic pain: Secondary | ICD-10-CM

## 2017-09-13 DIAGNOSIS — M542 Cervicalgia: Secondary | ICD-10-CM | POA: Diagnosis not present

## 2017-09-13 NOTE — Addendum Note (Signed)
Addended by: Albertina Parr on: 09/13/2017 10:54 AM   Modules accepted: Orders

## 2017-09-13 NOTE — Progress Notes (Signed)
Office Visit Note   Patient: Ashley Morton           Date of Birth: 1980/12/07           MRN: [id number] Visit Date: 09/13/2017              Requested by: Massie Maroon, FNP [address] [place], [place] [address] PCP: Massie Maroon, FNP   Assessment & Plan: Visit Diagnoses:  1. Chronic left shoulder pain   2. Cervicalgia     Plan: MRI of the cervical spine is consistent with bulging disc at C4-5 without any significant foraminal stenosis.  We will send her back to Dr. Alvester Morin for consideration of epidural steroid injection.  Hopefully this will give her relief.  Follow-up with me as needed.  Follow-Up Instructions: Return if symptoms worsen or fail to improve.   Orders:  No orders of the defined types were placed in this encounter.  No orders of the defined types were placed in this encounter.     Procedures: No procedures performed   Clinical Data: No additional findings.   Subjective: Chief Complaint  Patient presents with  . Spine - Follow-up    Patient follows up today for review of her cervical spine MRI.  She continues to have left shoulder and left arm pain.  She states that she had no relief whatsoever from the intra-articular shoulder injection   Review of Systems  Constitutional: Negative.   HENT: Negative.   Eyes: Negative.   Respiratory: Negative.   Cardiovascular: Negative.   Endocrine: Negative.   Musculoskeletal: Negative.   Neurological: Negative.   Hematological: Negative.   Psychiatric/Behavioral: Negative.   All other systems reviewed and are negative.    Objective: Vital Signs: LMP 08/19/2017   Physical Exam  Constitutional: She is oriented to person, place, and time. She appears well-developed and well-nourished.  Pulmonary/Chest: Effort normal.  Neurological: She is alert and oriented to person, place, and time.  Skin: Skin is warm. Capillary refill takes less than 2 seconds.  Psychiatric: She has a normal  mood and affect. Her behavior is normal. Judgment and thought content normal.  Nursing note and vitals reviewed.   Ortho Exam Exam is stable Specialty Comments:  No specialty comments available.  Imaging: No results found.   PMFS History: Patient Active Problem List   Diagnosis Date Noted  . Chronic left shoulder pain 08/06/2017  . Helicobacter pylori gastritis 12/26/2016  . Essential hypertension 08/03/2016  . History of high blood pressure 09/03/2015  . Prediabetes 09/03/2015  . Menorrhagia with irregular cycle 09/03/2015  . Tobacco dependence 09/03/2015  . Obesity 11/06/2014  . Abnormal uterine bleeding (AUB) 11/06/2014   Past Medical History:  Diagnosis Date  . Anemia   . GERD (gastroesophageal reflux disease)   . Helicobacter pylori gastritis 12/26/2016   Tx and eradicated  . Hypertension   . Peptic ulcer     Family History  Problem Relation Age of Onset  . Diabetes Maternal Grandmother   . Kidney disease Maternal Grandmother   . Heart disease Maternal Grandmother     Past Surgical History:  Procedure Laterality Date  . SHOULDER SURGERY     Social History   Occupational History  . Occupation: Research scientist (physical sciences): [employer]  Tobacco Use  . Smoking status: Current Every Day Smoker    Packs/day: 1.00    Types: Cigarettes  . Smokeless tobacco: Current User  Substance and Sexual  Activity  . Alcohol use: Yes    Alcohol/week: 0.0 oz    Comment: Once a week.   . Drug use: No  . Sexual activity: Yes    Partners: Male

## 2017-09-19 ENCOUNTER — Telehealth (INDEPENDENT_AMBULATORY_CARE_PROVIDER_SITE_OTHER): Payer: Self-pay | Admitting: *Deleted

## 2017-09-19 NOTE — Telephone Encounter (Signed)
Meloxicam 15 mg daily prn #20

## 2017-09-19 NOTE — Telephone Encounter (Signed)
Please advise 

## 2017-09-20 ENCOUNTER — Other Ambulatory Visit (INDEPENDENT_AMBULATORY_CARE_PROVIDER_SITE_OTHER): Payer: Self-pay

## 2017-09-20 MED ORDER — MELOXICAM 15 MG PO TABS: 15.0000 mg | ORAL_TABLET | Freq: Every day | ORAL | 0 refills | Status: AC

## 2017-09-20 MED ORDER — DIAZEPAM 5 MG PO TABS: ORAL_TABLET | ORAL | 0 refills | Status: AC

## 2017-09-20 NOTE — Telephone Encounter (Signed)
Called into pharm  

## 2017-09-20 NOTE — Telephone Encounter (Signed)
Pre-procedure valium rx. 

## 2017-09-20 NOTE — Telephone Encounter (Signed)
done

## 2017-09-20 NOTE — Telephone Encounter (Signed)
Informed pt about rx.

## 2017-09-20 NOTE — Telephone Encounter (Signed)
Patient aware.

## 2017-10-10 ENCOUNTER — Encounter

## 2017-10-10 ENCOUNTER — Encounter (INDEPENDENT_AMBULATORY_CARE_PROVIDER_SITE_OTHER): Payer: Self-pay | Admitting: Physical Medicine and Rehabilitation
